# Patient Record
Sex: Female | Born: 1980 | Race: White | Hispanic: No | Marital: Married | Smoking: Never smoker
Health system: Southern US, Community
[De-identification: ages and names within clinical notes are randomized; demographics above are authoritative.]

## PROBLEM LIST (undated history)

## (undated) ENCOUNTER — Inpatient Hospital Stay (HOSPITAL_COMMUNITY): Payer: Self-pay

## (undated) DIAGNOSIS — D6859 Other primary thrombophilia: Secondary | ICD-10-CM

## (undated) HISTORY — PX: OTHER SURGICAL HISTORY: SHX169

---

## 2015-01-05 HISTORY — PX: CERVICAL CERCLAGE: SHX1329

## 2015-03-11 ENCOUNTER — Encounter (HOSPITAL_COMMUNITY): Payer: Self-pay | Admitting: *Deleted

## 2015-03-11 ENCOUNTER — Inpatient Hospital Stay (HOSPITAL_COMMUNITY)
Admission: AD | Admit: 2015-03-11 | Discharge: 2015-03-11 | Disposition: A | Discharge status: Home / Self Care | Payer: Self-pay | Source: Ambulatory Visit | Attending: Obstetrics & Gynecology | Admitting: Obstetrics & Gynecology

## 2015-03-11 DIAGNOSIS — O2622 Pregnancy care for patient with recurrent pregnancy loss, second trimester: Secondary | ICD-10-CM

## 2015-03-11 DIAGNOSIS — O09292 Supervision of pregnancy with other poor reproductive or obstetric history, second trimester: Secondary | ICD-10-CM | POA: Insufficient documentation

## 2015-03-11 DIAGNOSIS — Z3A19 19 weeks gestation of pregnancy: Secondary | ICD-10-CM | POA: Insufficient documentation

## 2015-03-11 DIAGNOSIS — O0990 Supervision of high risk pregnancy, unspecified, unspecified trimester: Secondary | ICD-10-CM

## 2015-03-11 MED ORDER — ENOXAPARIN SODIUM 40 MG/0.4ML ~~LOC~~ SOLN: 40.0000 mg | SUBCUTANEOUS | Status: DC

## 2015-03-11 NOTE — Discharge Instructions (Signed)
Recurrent Pregnancy Loss  Recurrent pregnancy loss is the loss of three or more pregnancies before 20 weeks of gestation. Losing three or more pregnancies in a row is rare.   CAUSES   The most common cause of recurrent pregnancy loss is an abnormal number of chromosomes in the developing baby (fetus). Chromosomes are the structures inside cells that hold all your genetic material. In most cases of recurrent pregnancy loss, a missing or extra chromosome keeps a baby from developing. It may not be possible to identify which chromosome is defective.  Chromosome abnormalities can be inherited, but most of the time they occur by chance. Other possible causes of recurrent pregnancy loss include:  · Being born with an abnormal womb structure (septate uterus).  · Having noncancerous growths in your uterus (fibroids or polyps).  · Having a disease that causes scarring in your uterus (Asherman syndrome).  · Having a disease that causes your blood to clot (antiphospholipid syndrome).  · Having a disease that increases bleeding (thrombophilia).  RISK FACTORS  The risk of recurrent pregnancy loss increases as your age increases. Other risk factors include:  · Diabetes.  · Thyroid disease.  · Obesity.  · Smoking.  · Excessive alcohol or caffeine.  · Recreational drug use.  SIGNS AND SYMPTOMS  · Vaginal bleeding.  · Passing clots vaginally.  · Abdominal pain or cramps.  · Low back pain.  DIAGNOSIS   Your health care provider can create an image of your womb using sound waves and a computer (ultrasound) to confirm your pregnancy by 5 or 6 weeks after you conceive. Ultrasound can also confirm a pregnancy loss. Your health care provider may do a complete physical exam, including your vagina and uterus (pelvic exam), to find possible causes of recurrent pregnancy loss.   Other tests may include:  · Ultrasound imaging to see if the structure of your uterus is normal or if you have polyps or fibroids.  · Blood tests to see if you have a  disease that causes recurrent pregnancy loss.  · Blood tests to see if your blood clots normally.  · Genetic testing of you and your partner.  TREATMENT   In many cases, there is no specific treatment. Depending on the cause, possible treatments include:  · Having your eggs fertilized outside your uterus (in vitro fertilization). By doing this, a health care provider may be able to select eggs without chromosome abnormalities.  · Taking a blood thinner to prevent clotting if you have antiphospholipid syndrome.  · Having corrective surgery if you have an abnormality in your uterus.  HOME CARE INSTRUCTIONS   Follow all your health care provider's home instructions carefully. These may include:  · Do not smoke or use recreational drugs.  · Do not drink alcohol if you are pregnant.  · Do not put anything in your vagina or have sex for 2 weeks after you have had a miscarriage.  · If you are Rh negative and have had a miscarriage, you may need to get a shot of Rho (D) immune globulin. Ask your doctor if you need this shot.  · Use birth control if you do not want to get pregnant. You can get pregnant [redacted] weeks after a miscarriage.  · Get support from friends and loved ones. Miscarriage can be a sad and stressful event.  SEEK MEDICAL CARE IF:   · You have light vaginal bleeding or spotting while pregnant.  · You have been trying to get pregnant   without success.  · You are struggling with sadness or depression after a miscarriage.  SEEK IMMEDIATE MEDICAL CARE IF:  · You have heavy vaginal bleeding and abdominal cramps.  · You have fever, chills, and severe abdominal pain.   Document Released: 01/25/2008 Document Revised: 08/13/2013 Document Reviewed: 05/31/2013  ExitCare® Patient Information ©2015 [employer]. This information is not intended to replace advice given to you by your health care provider. Make sure you discuss any questions you have with your health care provider.

## 2015-03-11 NOTE — MAU Note (Signed)
Pt reports she is here to get an emergency medicine that she has been prescribed in [place] to prevent preg loss.

## 2015-03-11 NOTE — MAU Provider Note (Signed)
  History     CSN: [id number]  Arrival date and time: 03/11/15 2035   First Provider Initiated Contact with Patient 03/11/15 2254      No chief complaint on file.  HPI   Elizabeth Sanford 34 y.o. [id number] @19weeks  per pt presents for refill of medication.  She is from [place] and moved here 2 weeks ago.  Living in hotel at present looking for housing.  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  Was put on enoxaparin daily to help retain pregnancy.  NO complaints today.  Denies LOF, VB, dysuria.   Uses enoxaparin daily injection.    OB History    Gravida Para Term Preterm AB TAB SAB Ectopic Multiple Living   12    10  10   1       History reviewed. No pertinent past medical history.  Past Surgical History  Procedure Laterality Date  . Cesarean section      History reviewed. No pertinent family history.  History  Substance Use Topics  . Smoking status: Never Smoker   . Smokeless tobacco: Never Used  . Alcohol Use: No    Allergies: No Known Allergies  Prescriptions prior to admission  Medication Sig Dispense Refill Last Dose  . aspirin 81 MG tablet Take 81 mg by mouth daily.   03/11/2015 at Unknown time  . enoxaparin (LOVENOX) 40 MG/0.4ML injection Inject 40 mg into the skin daily.   03/10/2015 at 2300  . omega-3 acid ethyl esters (LOVAZA) 1 G capsule Take 1 g by mouth daily.   03/10/2015 at Unknown time  . predniSONE (DELTASONE) 5 MG tablet Take 5 mg by mouth 2 (two) times daily with a meal.   03/11/2015 at Unknown time  . Prenatal Vit-Fe Fumarate-FA (PRENATAL MULTIVITAMIN) TABS tablet Take 1 tablet by mouth daily at 12 noon.   03/10/2015 at Unknown time    ROS Pertinent ROS in HPI.  All other systems are negative.   Physical Exam   Blood pressure 111/70, pulse 104, temperature 99.1 F (37.3 C), temperature source Oral, resp. rate 16, height 5\' 4"  (1.626 m), weight 170 lb (77.111 kg), SpO2 100 %.  Physical Exam  Constitutional: She is oriented to person,  place, and time. She appears well-developed and well-nourished. No distress.  HENT:  Head: Normocephalic and atraumatic.  Eyes: EOM are normal.  Neck: Normal range of motion.  Cardiovascular: Normal rate, regular rhythm and normal heart sounds.   Respiratory: Effort normal and breath sounds normal. No respiratory distress.  GI: Soft. Bowel sounds are normal. She exhibits no distension. There is no tenderness.  Musculoskeletal: Normal range of motion.  Neurological: She is alert and oriented to person, place, and time.  Skin: Skin is warm and dry.  Psychiatric: She has a normal mood and affect.    MAU Course  Procedures  MDM Discussed with Dr. Despina HiddenEure.  He is agreeable to provide rx for further enoxeparin and [facility]    Assessment and Plan  A:  1. 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  Email sent to admin staff Patient may return to MAU as needed or if her condition were to change or worsen  Interpreter used in room for all communication.  Bertram Denvereague Clark, Mykenzie Ebanks E 03/11/2015, 10:59 PM

## 2015-03-17 ENCOUNTER — Encounter: Payer: Self-pay | Admitting: Obstetrics and Gynecology

## 2015-03-28 ENCOUNTER — Telehealth: Payer: Self-pay | Admitting: Obstetrics and Gynecology

## 2015-03-28 MED ORDER — ENOXAPARIN SODIUM 40 MG/0.4ML ~~LOC~~ SOLN: 40.0000 mg | SUBCUTANEOUS | Status: DC

## 2015-03-28 NOTE — Telephone Encounter (Signed)
Patient here for medication refill. The patient has an appointment on Aug 11 in the high risk clinic. She was prescribed Lovenox 40 sub Q on June 11th here in MAU, and has run out of the medication. She needs enough to get her until her appointment on August 11th. The patient was prescribed this medication in [place] due to multiple SAB's. I discussed this plan with Dr. Debroah Loop who agrees that we will give the patient 6 more refills to get her until her appointment in the [facility].

## 2015-04-02 ENCOUNTER — Ambulatory Visit (INDEPENDENT_AMBULATORY_CARE_PROVIDER_SITE_OTHER): Payer: Self-pay | Admitting: Family Medicine

## 2015-04-02 ENCOUNTER — Encounter: Payer: Self-pay | Admitting: Family Medicine

## 2015-04-02 VITALS — BP 117/71 | HR 100 | Temp 98.9°F | Wt 176.1 lb

## 2015-04-02 DIAGNOSIS — Z118 Encounter for screening for other infectious and parasitic diseases: Secondary | ICD-10-CM

## 2015-04-02 DIAGNOSIS — O99119 Other diseases of the blood and blood-forming organs and certain disorders involving the immune mechanism complicating pregnancy, unspecified trimester: Secondary | ICD-10-CM

## 2015-04-02 DIAGNOSIS — D688 Other specified coagulation defects: Secondary | ICD-10-CM

## 2015-04-02 DIAGNOSIS — Z9889 Other specified postprocedural states: Secondary | ICD-10-CM

## 2015-04-02 DIAGNOSIS — O2622 Pregnancy care for patient with recurrent pregnancy loss, second trimester: Secondary | ICD-10-CM

## 2015-04-02 DIAGNOSIS — Z98891 History of uterine scar from previous surgery: Secondary | ICD-10-CM | POA: Insufficient documentation

## 2015-04-02 DIAGNOSIS — Z113 Encounter for screening for infections with a predominantly sexual mode of transmission: Secondary | ICD-10-CM

## 2015-04-02 DIAGNOSIS — Z124 Encounter for screening for malignant neoplasm of cervix: Secondary | ICD-10-CM

## 2015-04-02 DIAGNOSIS — Z1151 Encounter for screening for human papillomavirus (HPV): Secondary | ICD-10-CM

## 2015-04-02 DIAGNOSIS — O3432 Maternal care for cervical incompetence, second trimester: Secondary | ICD-10-CM

## 2015-04-02 DIAGNOSIS — O0992 Supervision of high risk pregnancy, unspecified, second trimester: Secondary | ICD-10-CM

## 2015-04-02 DIAGNOSIS — D6859 Other primary thrombophilia: Secondary | ICD-10-CM

## 2015-04-02 DIAGNOSIS — O3430 Maternal care for cervical incompetence, unspecified trimester: Secondary | ICD-10-CM | POA: Insufficient documentation

## 2015-04-02 DIAGNOSIS — O2620 Pregnancy care for patient with recurrent pregnancy loss, unspecified trimester: Secondary | ICD-10-CM | POA: Insufficient documentation

## 2015-04-02 LAB — POCT URINALYSIS DIP (DEVICE)
Bilirubin Urine: NEGATIVE
Glucose, UA: NEGATIVE mg/dL
KETONES UR: 15 mg/dL — AB
Leukocytes, UA: NEGATIVE
Nitrite: NEGATIVE
PH: 5.5 (ref 5.0–8.0)
Protein, ur: NEGATIVE mg/dL
Urobilinogen, UA: 0.2 mg/dL (ref 0.0–1.0)

## 2015-04-02 MED ORDER — ENOXAPARIN SODIUM 40 MG/0.4ML ~~LOC~~ SOLN: 40.0000 mg | SUBCUTANEOUS | Status: DC

## 2015-04-02 NOTE — Progress Notes (Signed)
Subjective:    Elizabeth Sanford is a [id number] [redacted]w[redacted]d by LMP being seen today for her first obstetrical visit.  Her obstetrical history is significant for Recurrent pregnancy loss and previous cesarean section for breech. The patient has had 10 first trimester losses since her first delivery and now. She had testing in [place] which revealed protein C and protein S deficiencies. At the onset of her pregnancy, the patient was started on Lovenox, aspirin, and prednisone (5 mg twice a day). Additionally, around 14 weeks the patient had a cerclage placed ([employer] stitch) and she was started on oral progestin. At first the patient was unsure of the procedure that was done. However, in my presence the patient called her physician in [place], who related that she had a [employer] stitch.  Patient does intend to breast feed. Pregnancy history fully reviewed.  Patient reports no complaints.  Filed Vitals:   04/02/15 0843  BP: 117/71  Pulse: 100  Temp: 98.9 F (37.2 C)  Weight: 176 lb 1.6 oz (79.878 kg)    HISTORY: OB History  Gravida Para Term Preterm AB SAB TAB Ectopic Multiple Living  12 1 1  10 10    1     # Outcome Date GA Lbr Len/2nd Weight Sex Delivery Anes PTL Lv  12 Current           11 SAB 2015          10 SAB 2014          9 SAB 2013          8 SAB 2012          7 SAB 2011          6 SAB 2010          5 SAB 2010          4 SAB 2009          3 SAB 2008          2 SAB 2007          1 Term 02/20/05 [redacted]w[redacted]d    CS-Unspec   Y     Comments: c/s breech, born in [place]. No other complications     History reviewed. No pertinent past medical history. Past Surgical History  Procedure Laterality Date  . Cesarean section    . Laparascopy    . Cervical cerclage  01/05/15   Family History  Problem Relation Age of Onset  . Hypertension Mother   . Diabetes Mother   . Hypertension Father      Exam    Uterus:     Pelvic Exam:    Perineum: No Hemorrhoids, Normal Perineum    Vulva: normal   Vagina:  normal mucosa, normal discharge       Cervix: no cervical motion tenderness and nulliparous appearance the cerclage was not visualized. It was palpated with the knot in the 6 o'clock position    Adnexa: not evaluated   Bony Pelvis: gynecoid  System:     Skin: normal coloration and turgor, no rashes    Neurologic: oriented, normal mood, gait normal; reflexes normal and symmetric   Extremities: normal strength, tone, and muscle mass   HEENT PERRLA, extra ocular movement intact and sclera clear, anicteric   Mouth/Teeth mucous membranes moist, pharynx normal without lesions   Neck supple and no masses   Cardiovascular: regular rate and rhythm, no murmurs or gallops   Respiratory:  appears well, vitals normal, no  respiratory distress, acyanotic, normal RR, ear and throat exam is normal, neck free of mass or lymphadenopathy, chest clear, no wheezing, crepitations, rhonchi, normal symmetric air entry   Abdomen: soft, non-tender; bowel sounds normal; no masses,  no organomegaly   Urinary: urethral meatus normal      Assessment:    Pregnancy: [id number] Patient Active Problem List   Diagnosis Date Noted  . Protein C deficiency affecting pregnancy 04/02/2015  . Protein S deficiency 04/02/2015  . Recurrent pregnancy loss with current pregnancy 04/02/2015  . Previous cesarean section 04/02/2015  . Supervision of high-risk pregnancy 03/11/2015        Plan:     Initial labs drawn. Prenatal vitamins. Problem list reviewed and updated. Genetic Screening discussed: too late to care.Marland Kitchen  Ultrasound discussed; fetal survey: requested and ordered  Follow up in 4 weeks. 75% % of 45 min visit spent on counseling and coordination of care.  Continue Lovenox, aspirin, progesterone. Will evaluate cervical length on ultrasound. If shortened, will continue with progesterone. If it appears that the cervix is normal length, can stop the progesterone. Also discussed that the  prednisone is not a typical treatment for recurrent pregnancy loss. I discussed with her regarding weaning the prednisone, which she had started at the beginning of her pregnancy. We'll wean to prednisone 5 mg daily for the next 2 weeks, then 2.5 mg until her next appointment. I discussed with her that if she has any problems or side effects from the withdrawal, she should call our office to notify us.   Candelaria Celeste JEHIEL 04/02/2015

## 2015-04-02 NOTE — Progress Notes (Signed)
Here for first visit, from [place]. Moderate leukocytes and hemoglobin noted on urinalysis. Given prenatal education booklets. Used Interpreter [employer].

## 2015-04-02 NOTE — Progress Notes (Signed)
Nutrition note: 1st visit consult Pt has gained 6.1# @ [redacted]w[redacted]d, which is wnl. Pt reports eating 3 meals & 2 snacks/d. Pt is taking a PNV. Pt reports no N&V but has some heartburn. NKFA. Pt received verbal & written education via an interpreter on general nutrition during pregnancy. Discussed tips to decrease heartburn. Discussed BF tip of the week & benefits of rooming-in. Discussed wt gain goals of 15-25# or 0.6#/wk. Pt agrees to continue taking a PNV. Pt has WIC & plans to BF. F/u as needed Blondell Reveal, MS, RD, LDN, [facility]

## 2015-04-02 NOTE — Progress Notes (Signed)
Ultrasound scheduled for 04/08/2015 @ 1:00PM

## 2015-04-03 ENCOUNTER — Other Ambulatory Visit: Payer: Self-pay | Admitting: Obstetrics & Gynecology

## 2015-04-03 LAB — CULTURE, OB URINE: Colony Count: 30000

## 2015-04-03 LAB — GLUCOSE TOLERANCE, 1 HOUR (50G) W/O FASTING: Glucose, 1 Hour GTT: 116 mg/dL (ref 70–140)

## 2015-04-03 LAB — CYTOLOGY - PAP

## 2015-04-03 MED ORDER — ENOXAPARIN SODIUM 40 MG/0.4ML ~~LOC~~ SOLN: 40.0000 mg | SUBCUTANEOUS | Status: DC

## 2015-04-03 NOTE — Telephone Encounter (Signed)
Pt had an issue with the prescription being received by Nicolette Bang so I re prescribed it via EPIC upon request

## 2015-04-06 ENCOUNTER — Other Ambulatory Visit: Payer: Self-pay | Admitting: Family Medicine

## 2015-04-06 LAB — HEMOGLOBINOPATHY EVALUATION
HGB F QUANT: 0 % (ref 0.0–2.0)
HGB S QUANTITAION: 0 %
Hemoglobin Other: 0 %
Hgb A2 Quant: 2.7 % (ref 2.2–3.2)
Hgb A: 97.3 % (ref 96.8–97.8)

## 2015-04-06 LAB — PRENATAL PROFILE (SOLSTAS)
ANTIBODY SCREEN: NEGATIVE
Basophils Absolute: 0 10*3/uL (ref 0.0–0.1)
Basophils Relative: 0 % (ref 0–1)
EOS ABS: 0 10*3/uL (ref 0.0–0.7)
EOS PCT: 0 % (ref 0–5)
HCT: 32.8 % — ABNORMAL LOW (ref 36.0–46.0)
HIV 1&2 Ab, 4th Generation: NONREACTIVE
Hemoglobin: 11.1 g/dL — ABNORMAL LOW (ref 12.0–15.0)
Hepatitis B Surface Ag: NEGATIVE
Lymphocytes Relative: 21 % (ref 12–46)
Lymphs Abs: 3.5 10*3/uL (ref 0.7–4.0)
MCH: 30 pg (ref 26.0–34.0)
MCHC: 33.8 g/dL (ref 30.0–36.0)
MCV: 88.6 fL (ref 78.0–100.0)
MONO ABS: 1 10*3/uL (ref 0.1–1.0)
MPV: 10.2 fL (ref 8.6–12.4)
Monocytes Relative: 6 % (ref 3–12)
Neutro Abs: 12.3 10*3/uL — ABNORMAL HIGH (ref 1.7–7.7)
Neutrophils Relative %: 73 % (ref 43–77)
PLATELETS: 225 10*3/uL (ref 150–400)
RBC: 3.7 MIL/uL — AB (ref 3.87–5.11)
RDW: 14 % (ref 11.5–15.5)
RH TYPE: POSITIVE
RUBELLA: 16.4 {index} — AB (ref <0.90)
WBC: 16.8 10*3/uL — ABNORMAL HIGH (ref 4.0–10.5)

## 2015-04-06 MED ORDER — ENOXAPARIN SODIUM 40 MG/0.4ML ~~LOC~~ SOLN: 40.0000 mg | SUBCUTANEOUS | Status: AC

## 2015-04-07 ENCOUNTER — Other Ambulatory Visit: Payer: Self-pay | Admitting: Family Medicine

## 2015-04-07 DIAGNOSIS — O09522 Supervision of elderly multigravida, second trimester: Secondary | ICD-10-CM

## 2015-04-07 DIAGNOSIS — Z1389 Encounter for screening for other disorder: Secondary | ICD-10-CM

## 2015-04-07 DIAGNOSIS — Z363 Encounter for antenatal screening for malformations: Secondary | ICD-10-CM

## 2015-04-07 DIAGNOSIS — Z3A23 23 weeks gestation of pregnancy: Secondary | ICD-10-CM

## 2015-04-07 DIAGNOSIS — O0992 Supervision of high risk pregnancy, unspecified, second trimester: Secondary | ICD-10-CM

## 2015-04-07 LAB — PRESCRIPTION MONITORING PROFILE (19 PANEL)
AMPHETAMINE/METH: NEGATIVE ng/mL
BUPRENORPHINE, URINE: NEGATIVE ng/mL
Barbiturate Screen, Urine: NEGATIVE ng/mL
Benzodiazepine Screen, Urine: NEGATIVE ng/mL
Cannabinoid Scrn, Ur: NEGATIVE ng/mL
Carisoprodol, Urine: NEGATIVE ng/mL
Cocaine Metabolites: NEGATIVE ng/mL
Creatinine, Urine: 155.75 mg/dL (ref >20.0)
ECSTASY: NEGATIVE ng/mL
Fentanyl, Ur: NEGATIVE ng/mL
METHAQUALONE SCREEN (URINE): NEGATIVE ng/mL
Meperidine, Ur: NEGATIVE ng/mL
Methadone Screen, Urine: NEGATIVE ng/mL
NITRITES URINE, INITIAL: NEGATIVE ug/mL
Opiate Screen, Urine: NEGATIVE ng/mL
Oxycodone Screen, Ur: NEGATIVE ng/mL
PHENCYCLIDINE, UR: NEGATIVE ng/mL
PROPOXYPHENE: NEGATIVE ng/mL
Tapentadol, urine: NEGATIVE ng/mL
Tramadol Scrn, Ur: NEGATIVE ng/mL
Zolpidem, Urine: NEGATIVE ng/mL
pH, Initial: 5.8 pH (ref 4.5–8.9)

## 2015-04-08 ENCOUNTER — Encounter (HOSPITAL_COMMUNITY): Payer: Self-pay

## 2015-04-08 ENCOUNTER — Ambulatory Visit (HOSPITAL_COMMUNITY)
Admission: RE | Admit: 2015-04-08 | Discharge: 2015-04-08 | Disposition: A | Discharge status: Home / Self Care | Payer: Self-pay | Source: Ambulatory Visit | Attending: Family Medicine | Admitting: Family Medicine

## 2015-04-08 DIAGNOSIS — Z36 Encounter for antenatal screening of mother: Secondary | ICD-10-CM | POA: Insufficient documentation

## 2015-04-08 DIAGNOSIS — O0992 Supervision of high risk pregnancy, unspecified, second trimester: Secondary | ICD-10-CM

## 2015-04-08 DIAGNOSIS — Z1389 Encounter for screening for other disorder: Secondary | ICD-10-CM

## 2015-04-08 DIAGNOSIS — Z363 Encounter for antenatal screening for malformations: Secondary | ICD-10-CM

## 2015-04-08 DIAGNOSIS — Z3A23 23 weeks gestation of pregnancy: Secondary | ICD-10-CM

## 2015-04-08 DIAGNOSIS — O09522 Supervision of elderly multigravida, second trimester: Secondary | ICD-10-CM

## 2015-04-10 ENCOUNTER — Encounter: Payer: Self-pay | Admitting: Family Medicine

## 2015-04-10 DIAGNOSIS — O09529 Supervision of elderly multigravida, unspecified trimester: Secondary | ICD-10-CM | POA: Insufficient documentation

## 2015-04-14 ENCOUNTER — Telehealth: Payer: Self-pay | Admitting: General Practice

## 2015-04-14 DIAGNOSIS — N96 Recurrent pregnancy loss: Secondary | ICD-10-CM

## 2015-04-14 MED ORDER — PROGESTERONE MICRONIZED 200 MG PO CAPS: 200.0000 mg | ORAL_CAPSULE | Freq: Every day | ORAL | Status: DC

## 2015-04-14 NOTE — Telephone Encounter (Addendum)
Per Dr Adrian Blackwater, patient's ultrasound and cervical length were normal. Patient should continue progesterone & authorized Rx for prometrium  vaginally at night. Called patient with pacific interpreter [phone] and informed her of ultrasound results and medication recommendations. Patient verbalized understanding and asked if she could continue the current progesterone she is on until it runs out then start the new one. Told patient that she certainly could but the recommendation was for her to switch to ours and discontinue her current one. Patient verbalized understanding and asked about her blood work. Told patient everything came back fine on her blood work as well. Patient asked about WBC count because in [place] it was very high and they had her on antibiotics already. Told patient that her white count was elevated but the doctor didn't recommend anything for that as they are numerous reasons her white count could be elevated. Patient verbalized understanding and had no other questions at this time.

## 2015-04-25 ENCOUNTER — Other Ambulatory Visit: Payer: Self-pay | Admitting: Advanced Practice Midwife

## 2015-04-25 DIAGNOSIS — O2622 Pregnancy care for patient with recurrent pregnancy loss, second trimester: Secondary | ICD-10-CM

## 2015-04-25 MED ORDER — PREDNISONE 2.5 MG PO TABS: 2.5000 mg | ORAL_TABLET | Freq: Every day | ORAL | Status: DC

## 2015-04-25 NOTE — Progress Notes (Signed)
Came to MAU desk needing prednisone refill. Is [facility] patient. Ran out and clinic is closed today. Was Rx'd prednisone in [place] for recurrent first trimester SABs. Per NOB note 04/02/15 w/ Dr. Adrian Blackwater plan is to wean pt off. Instructed to take 5 mg QD x 2 weeks (8/11-8/25), then decrease to 2.5 mg per day until next appt. (scheduked 9/8). Will Rx 1 week supply of prednisone 2.5 mg PO QD.

## 2015-04-25 NOTE — Addendum Note (Signed)
Addended by: Dorathy Kinsman on: 04/25/2015 03:16 PM   Modules accepted: Orders

## 2015-04-30 ENCOUNTER — Ambulatory Visit (INDEPENDENT_AMBULATORY_CARE_PROVIDER_SITE_OTHER): Payer: Self-pay | Admitting: Obstetrics & Gynecology

## 2015-04-30 VITALS — BP 129/68 | HR 88 | Temp 98.2°F | Wt 186.3 lb

## 2015-04-30 DIAGNOSIS — O0992 Supervision of high risk pregnancy, unspecified, second trimester: Secondary | ICD-10-CM

## 2015-04-30 DIAGNOSIS — O2622 Pregnancy care for patient with recurrent pregnancy loss, second trimester: Secondary | ICD-10-CM

## 2015-04-30 DIAGNOSIS — D6859 Other primary thrombophilia: Secondary | ICD-10-CM

## 2015-04-30 DIAGNOSIS — D688 Other specified coagulation defects: Secondary | ICD-10-CM

## 2015-04-30 DIAGNOSIS — O99119 Other diseases of the blood and blood-forming organs and certain disorders involving the immune mechanism complicating pregnancy, unspecified trimester: Principal | ICD-10-CM

## 2015-04-30 DIAGNOSIS — O09522 Supervision of elderly multigravida, second trimester: Secondary | ICD-10-CM

## 2015-04-30 LAB — POCT URINALYSIS DIP (DEVICE)
Bilirubin Urine: NEGATIVE
GLUCOSE, UA: NEGATIVE mg/dL
Ketones, ur: NEGATIVE mg/dL
Leukocytes, UA: NEGATIVE
Nitrite: NEGATIVE
PROTEIN: NEGATIVE mg/dL
UROBILINOGEN UA: 0.2 mg/dL (ref 0.0–1.0)
pH: 6 (ref 5.0–8.0)

## 2015-04-30 NOTE — Progress Notes (Signed)
Elizabeth Sanford used for interpreter Patient still taking progesterone prescribed from Dr in [place], has not started our prescription yet  Discussed tdap/flu with patient, patient prefers to receive at [facility] due to cost Reviewed tip of week with patient

## 2015-04-30 NOTE — Patient Instructions (Signed)
Return to clinic for any obstetric concerns or go to MAU for evaluation  

## 2015-04-30 NOTE — Progress Notes (Signed)
Subjective:  Elizabeth Sanford is a 34 y.o. [id number] at [redacted]w[redacted]d being seen today for ongoing prenatal care. Arabic interpreter present. Patient reports no complaints.  Contractions: Not present.  Vag. Bleeding: None. Movement: Present. Denies leaking of fluid.   The following portions of the patient's history were reviewed and updated as appropriate: allergies, current medications, past family history, past medical history, past social history, past surgical history and problem list.   Objective:   Filed Vitals:   04/30/15 0934  BP: 129/68  Pulse: 88  Temp: 98.2 F (36.8 C)  Weight: 186 lb 4.8 oz (84.505 kg)    Fetal Status: Fetal Heart Rate (bpm): 164 Fundal Height: 29 cm Movement: Present     General:  Alert, oriented and cooperative. Patient is in no acute distress.  Skin: Skin is warm and dry. No rash noted.   Cardiovascular: Normal heart rate noted  Respiratory: Normal respiratory effort, no problems with respiration noted  Abdomen: Soft, gravid, appropriate for gestational age. Pain/Pressure: Present     Pelvic: Vag. Bleeding: None     Cervical exam deferred        Extremities: Normal range of motion.  Edema: None  Mental Status: Normal mood and affect. Normal behavior. Normal judgment and thought content.   Urinalysis: Urine Protein: Negative Urine Glucose: Negative  Assessment and Plan:  Pregnancy: [id number] at [redacted]w[redacted]d  1. Protein C  and S deficiency affecting pregnancy 2. Recurrent pregnancy loss with current pregnancy, second trimester Refused to stop taking Prednisone; MD in [place] told her to take until 9 months Warned about side effects of prolonged steroid exposure, advised to take lowest dose possible  3. AMA (advanced maternal age) multigravida 35+, second trimester  Preterm labor symptoms and general obstetric precautions including but not limited to vaginal bleeding, contractions, leaking of fluid and fetal movement were reviewed in detail with the patient. Please  refer to After Visit Summary for other counseling recommendations.  Return in about 3 weeks (around 05/21/2015) for 1 hr GTT, 3rd trimester labs.   Tereso Newcomer, MD

## 2015-05-05 ENCOUNTER — Telehealth: Payer: Self-pay | Admitting: General Practice

## 2015-05-05 DIAGNOSIS — O2622 Pregnancy care for patient with recurrent pregnancy loss, second trimester: Secondary | ICD-10-CM

## 2015-05-05 DIAGNOSIS — N96 Recurrent pregnancy loss: Secondary | ICD-10-CM

## 2015-05-05 MED ORDER — PREDNISONE 2.5 MG PO TABS: 2.5000 mg | ORAL_TABLET | Freq: Every day | ORAL | Status: DC

## 2015-05-05 MED ORDER — PROGESTERONE MICRONIZED 200 MG PO CAPS: 200.0000 mg | ORAL_CAPSULE | Freq: Every day | ORAL | Status: DC

## 2015-05-05 NOTE — Telephone Encounter (Signed)
Patient called and left message stating she saw Dr Macon Large on 9/8. Patient's message states something about a prescription sent to her pharmacy but the message was unclear. Called patient back and she states the prednisone and progesterone prescription are not at her [facility] pharmacy. Patient states she went there Saturday and was told they had nothing for her. Told patient we will resubmit those to her pharmacy. Patient verbalized understanding and had no questions

## 2015-05-13 ENCOUNTER — Telehealth: Payer: Self-pay | Admitting: *Deleted

## 2015-05-13 MED ORDER — PROMETHAZINE HCL 25 MG PO TABS
25.0000 mg | ORAL_TABLET | Freq: Four times a day (QID) | ORAL | Status: DC | PRN
Start: 2015-05-13 — End: 2015-06-08

## 2015-05-13 NOTE — Telephone Encounter (Signed)
Elizabeth Sanford called again today at 11:32 and left another voice message. Hard to understand due to thick accent but she states she is calling about prednisone. States was prescribed at last visit and she asked for a month's worse but she only got 7 days worth. States not sure if doctor didn't understand her. States needs 14 more. Request a call.

## 2015-05-13 NOTE — Telephone Encounter (Addendum)
Called pt and informed her that it appears from Dr. Mont Dutton note on 9/8 that she understood what pt was asking for in regards to prednisone. Her note also stated that she had concerns for side effects and had discussed this with the pt. I will send a message to Dr. Macon Large with her request for additional prednisone and will call her back once I have received a response. I have sent a prescription for phenergan to her pharmacy which was approved by another physician. Pt voiced understanding of all information and instructions given.   9/22  1525  See note @ 1522 today.  Khylan Sawyer RNC

## 2015-05-13 NOTE — Telephone Encounter (Signed)
Pt left message stating that she was at the clinic on 9/8 and saw Dr. Macon Large. She further stated that she asked the doctor for a 30 day prescription of prednisone 2.5 mg but was only given Rx for 7 days (7 tablets total).  She is requesting additional Rx for prednisone. She also requests anti-emetic Rx as she has been nauseous.

## 2015-05-14 ENCOUNTER — Other Ambulatory Visit: Payer: Self-pay | Admitting: Obstetrics & Gynecology

## 2015-05-14 DIAGNOSIS — O2622 Pregnancy care for patient with recurrent pregnancy loss, second trimester: Secondary | ICD-10-CM

## 2015-05-14 MED ORDER — PREDNISONE 2.5 MG PO TABS: 2.5000 mg | ORAL_TABLET | Freq: Every day | ORAL | Status: DC

## 2015-05-14 NOTE — Progress Notes (Signed)
Called pt and informed her that the refill request for Prednisone 2.5 mg has been approved and sent to her pharmacy by Dr. Macon Large. She has been given #30 tablets. Once this prescription is completed, Dr. Macon Large recommends to reduce the strength to  tablets.  Pt voiced understanding of all information and agrees to reduction in strength of prednisone to  tablets after 30 days.

## 2015-05-20 ENCOUNTER — Ambulatory Visit (HOSPITAL_COMMUNITY): Admission: RE | Admit: 2015-05-20 | Payer: Self-pay | Source: Ambulatory Visit

## 2015-05-25 ENCOUNTER — Ambulatory Visit (INDEPENDENT_AMBULATORY_CARE_PROVIDER_SITE_OTHER): Payer: Self-pay | Admitting: Obstetrics and Gynecology

## 2015-05-25 ENCOUNTER — Encounter: Payer: Self-pay | Admitting: Obstetrics and Gynecology

## 2015-05-25 VITALS — BP 116/75 | HR 98 | Wt 188.5 lb

## 2015-05-25 DIAGNOSIS — O0993 Supervision of high risk pregnancy, unspecified, third trimester: Secondary | ICD-10-CM

## 2015-05-25 DIAGNOSIS — O3433 Maternal care for cervical incompetence, third trimester: Secondary | ICD-10-CM

## 2015-05-25 DIAGNOSIS — Z98891 History of uterine scar from previous surgery: Secondary | ICD-10-CM

## 2015-05-25 DIAGNOSIS — O09523 Supervision of elderly multigravida, third trimester: Secondary | ICD-10-CM

## 2015-05-25 DIAGNOSIS — D6859 Other primary thrombophilia: Secondary | ICD-10-CM

## 2015-05-25 DIAGNOSIS — O99119 Other diseases of the blood and blood-forming organs and certain disorders involving the immune mechanism complicating pregnancy, unspecified trimester: Secondary | ICD-10-CM

## 2015-05-25 LAB — POCT URINALYSIS DIP (DEVICE)
Bilirubin Urine: NEGATIVE
GLUCOSE, UA: NEGATIVE mg/dL
KETONES UR: 15 mg/dL — AB
Nitrite: NEGATIVE
Protein, ur: NEGATIVE mg/dL
SPECIFIC GRAVITY, URINE: 1.02 (ref 1.005–1.030)
Urobilinogen, UA: 0.2 mg/dL (ref 0.0–1.0)
pH: 6 (ref 5.0–8.0)

## 2015-05-25 LAB — GLUCOSE, CAPILLARY: Glucose-Capillary: 100 mg/dL — ABNORMAL HIGH (ref 65–99)

## 2015-05-25 NOTE — Progress Notes (Signed)
Subjective:  Elizabeth Sanford is a 34 y.o. [id number] at [redacted]w[redacted]d being seen today for ongoing prenatal care.  Patient reports backache.  Contractions: Not present.  Vag. Bleeding: None. Movement: Present. Denies leaking of fluid.   The following portions of the patient's history were reviewed and updated as appropriate: allergies, current medications, past family history, past medical history, past social history, past surgical history and problem list.   Objective:   Filed Vitals:   05/25/15 0820  BP: 116/75  Pulse: 98  Weight: 188 lb 8 oz (85.503 kg)    Fetal Status: Fetal Heart Rate (bpm): 160   Movement: Present     General:  Alert, oriented and cooperative. Patient is in no acute distress.  Skin: Skin is warm and dry. No rash noted.   Cardiovascular: Normal heart rate noted  Respiratory: Normal respiratory effort, no problems with respiration noted  Abdomen: Soft, gravid, appropriate for gestational age. Pain/Pressure: Present     Pelvic: Vag. Bleeding: None     Cervical exam deferred        Extremities: Normal range of motion.  Edema: None  Mental Status: Normal mood and affect. Normal behavior. Normal judgment and thought content.   Urinalysis:      Assessment and Plan:  Pregnancy: [id number] at [redacted]w[redacted]d  1. Supervision of high-risk pregnancy, third trimester Patient declined 1 hr GCT due to cost ([facility] [facility] mom program). 2hr p breakfast CBG 100. Patient may reconsider glucola test at next visit - CBC - RPR - HIV antibody (with reflex) - Patient will take 1/2 tablet of prednisone daily from now on  2. Protein S deficiency (HCC) 3. Protein C deficiency affecting pregnancy (HCC) Continue Lovenox Continue ASA  4. Previous cesarean section Discussed TOLAC vs RCS. Patient undecided. Will give an answer at next visit  5. Cervical cerclage suture present, third trimester Will remove at 36-37 weeks  6. AMA (advanced maternal age) multigravida 35+, third trimester   Preterm  labor symptoms and general obstetric precautions including but not limited to vaginal bleeding, contractions, leaking of fluid and fetal movement were reviewed in detail with the patient. Please refer to After Visit Summary for other counseling recommendations.  Return in about 2 weeks (around 06/08/2015).   Catalina Antigua, MD

## 2015-05-25 NOTE — Progress Notes (Signed)
Pt refuses to do 1 hr gtt.  Pt refuses to take tdap and flu shot in our clinic, states that she will go to [facility].

## 2015-05-25 NOTE — Addendum Note (Signed)
Addended by: Sherre Lain A on: 05/25/2015 10:34 AM   Modules accepted: Orders

## 2015-06-08 ENCOUNTER — Ambulatory Visit (INDEPENDENT_AMBULATORY_CARE_PROVIDER_SITE_OTHER): Payer: Self-pay | Admitting: Obstetrics and Gynecology

## 2015-06-08 VITALS — BP 131/67 | HR 96 | Temp 98.8°F | Wt 191.1 lb

## 2015-06-08 DIAGNOSIS — O3433 Maternal care for cervical incompetence, third trimester: Secondary | ICD-10-CM

## 2015-06-08 DIAGNOSIS — O0993 Supervision of high risk pregnancy, unspecified, third trimester: Secondary | ICD-10-CM

## 2015-06-08 DIAGNOSIS — D6859 Other primary thrombophilia: Secondary | ICD-10-CM

## 2015-06-08 DIAGNOSIS — O368131 Decreased fetal movements, third trimester, fetus 1: Secondary | ICD-10-CM

## 2015-06-08 DIAGNOSIS — Z98891 History of uterine scar from previous surgery: Secondary | ICD-10-CM

## 2015-06-08 DIAGNOSIS — O0990 Supervision of high risk pregnancy, unspecified, unspecified trimester: Secondary | ICD-10-CM

## 2015-06-08 DIAGNOSIS — O99119 Other diseases of the blood and blood-forming organs and certain disorders involving the immune mechanism complicating pregnancy, unspecified trimester: Secondary | ICD-10-CM

## 2015-06-08 LAB — CBC
HEMATOCRIT: 31.9 % — AB (ref 36.0–46.0)
Hemoglobin: 10.6 g/dL — ABNORMAL LOW (ref 12.0–15.0)
MCH: 29.7 pg (ref 26.0–34.0)
MCHC: 33.2 g/dL (ref 30.0–36.0)
MCV: 89.4 fL (ref 78.0–100.0)
MPV: 10.9 fL (ref 8.6–12.4)
PLATELETS: 200 10*3/uL (ref 150–400)
RBC: 3.57 MIL/uL — ABNORMAL LOW (ref 3.87–5.11)
RDW: 14.1 % (ref 11.5–15.5)
WBC: 11.3 10*3/uL — AB (ref 4.0–10.5)

## 2015-06-08 LAB — POCT URINALYSIS DIP (DEVICE)
Bilirubin Urine: NEGATIVE
Glucose, UA: NEGATIVE mg/dL
KETONES UR: NEGATIVE mg/dL
Nitrite: NEGATIVE
PH: 6 (ref 5.0–8.0)
PROTEIN: NEGATIVE mg/dL
Specific Gravity, Urine: 1.01 (ref 1.005–1.030)
UROBILINOGEN UA: 0.2 mg/dL (ref 0.0–1.0)

## 2015-06-08 NOTE — Progress Notes (Signed)
Used interpreter Clemens CatholicSarra Gabralla.    C/o feels baby is moving less since about 10 days ago. States before felt baby move more often, states now moves less often. States unable to state how much less, but that it is a change. FHR with baseline 158 with acceleration to 170 noted. [facility]. Will bring letter showing when they are done.

## 2015-06-08 NOTE — Progress Notes (Signed)
Ultrasound scheduled 06/12/2015 @ 7:30AM

## 2015-06-08 NOTE — Progress Notes (Signed)
Subjective:  Elizabeth Sanford is a 34 y.o. [id number] at 3218w5d being seen today for ongoing prenatal care.  Patient reports decreased fetal movement last week or two, no leakage of fluid or bleeding. .Contractions: Not present.  Vag. Bleeding: None. Movement: (!) Decreased. Denies leaking of fluid.   The following portions of the patient's history were reviewed and updated as appropriate: allergies, current medications, past family history, past medical history, past social history, past surgical history and problem list. Problem list updated.  Objective:   Filed Vitals:   06/08/15 0853  BP: 131/67  Pulse: 96  Temp: 98.8 F (37.1 C)  Weight: 191 lb 1.6 oz (86.682 kg)    Fetal Status: Fetal Heart Rate (bpm): 158 Fundal Height: 34 cm Movement: (!) Decreased     General:  Alert, oriented and cooperative. Patient is in no acute distress.  Skin: Skin is warm and dry. No rash noted.   Cardiovascular: Normal heart rate noted  Respiratory: Normal respiratory effort, no problems with respiration noted  Abdomen: Soft, gravid, appropriate for gestational age. Pain/Pressure: Absent     Pelvic: Vag. Bleeding: None     Cervical exam deferred        Extremities: Normal range of motion.  Edema: Trace  Mental Status: Normal mood and affect. Normal behavior. Normal judgment and thought content.   Urinalysis: Urine Protein: Negative Urine Glucose: Negative  Assessment and Plan:  Pregnancy: [id number] at 1818w5d  # Hx recurrent miscarriage - continuing lovenox/aspirin - growth scan ordered per mfm  # Hx c/s for breech - risks and benefits discussed, tolac consent signed  # Pregnancy - declined today 2/2 cost, [facility] - CBC today with 1-hour gtt, patient declines hiv/rpr despite my strong recommendation  # Decreased fetal movement - fundal height appropriate - NST today reactive, fundal height appropriate - growth scan as above   Preterm labor symptoms and general obstetric  precautions including but not limited to vaginal bleeding, contractions, leaking of fluid and fetal movement were reviewed in detail with the patient. Please refer to After Visit Summary for other counseling recommendations.  Return in about 2 weeks (around 06/22/2015).   Kathrynn RunningNoah Bedford Armen Waring, MD

## 2015-06-09 LAB — GLUCOSE TOLERANCE, 1 HOUR (50G) W/O FASTING: Glucose, 1 Hour GTT: 115 mg/dL (ref 70–140)

## 2015-06-11 ENCOUNTER — Encounter: Payer: Self-pay | Admitting: *Deleted

## 2015-06-12 ENCOUNTER — Encounter (HOSPITAL_COMMUNITY): Payer: Self-pay

## 2015-06-12 ENCOUNTER — Ambulatory Visit (HOSPITAL_COMMUNITY)
Admission: RE | Admit: 2015-06-12 | Discharge: 2015-06-12 | Disposition: A | Discharge status: Home / Self Care | Payer: Self-pay | Source: Ambulatory Visit | Attending: Obstetrics and Gynecology | Admitting: Obstetrics and Gynecology

## 2015-06-12 DIAGNOSIS — O09522 Supervision of elderly multigravida, second trimester: Secondary | ICD-10-CM | POA: Insufficient documentation

## 2015-06-12 HISTORY — DX: Other primary thrombophilia: D68.59

## 2015-06-15 ENCOUNTER — Encounter: Payer: Self-pay | Admitting: *Deleted

## 2015-06-22 ENCOUNTER — Ambulatory Visit (INDEPENDENT_AMBULATORY_CARE_PROVIDER_SITE_OTHER): Payer: Self-pay | Admitting: Obstetrics & Gynecology

## 2015-06-22 VITALS — BP 109/62 | HR 94 | Temp 97.9°F | Wt 196.4 lb

## 2015-06-22 DIAGNOSIS — O2623 Pregnancy care for patient with recurrent pregnancy loss, third trimester: Secondary | ICD-10-CM

## 2015-06-22 DIAGNOSIS — O2622 Pregnancy care for patient with recurrent pregnancy loss, second trimester: Secondary | ICD-10-CM

## 2015-06-22 DIAGNOSIS — O0993 Supervision of high risk pregnancy, unspecified, third trimester: Secondary | ICD-10-CM

## 2015-06-22 DIAGNOSIS — O09523 Supervision of elderly multigravida, third trimester: Secondary | ICD-10-CM

## 2015-06-22 LAB — POCT URINALYSIS DIP (DEVICE)
Bilirubin Urine: NEGATIVE
Glucose, UA: NEGATIVE mg/dL
Ketones, ur: NEGATIVE mg/dL
LEUKOCYTES UA: NEGATIVE
Nitrite: NEGATIVE
PROTEIN: NEGATIVE mg/dL
Specific Gravity, Urine: 1.025 (ref 1.005–1.030)
UROBILINOGEN UA: 0.2 mg/dL (ref 0.0–1.0)
pH: 6 (ref 5.0–8.0)

## 2015-06-22 MED ORDER — PANTOPRAZOLE SODIUM 40 MG PO TBEC
40.0000 mg | DELAYED_RELEASE_TABLET | Freq: Every day | ORAL | Status: DC
Start: 2015-06-22 — End: 2015-07-18

## 2015-06-22 NOTE — Progress Notes (Signed)
<  90 %ile on US 10 days, has reflux sx Subjective:  Elizabeth Sanford is a 34 y.o. [id number] at 3154w5d being seen today for ongoing prenatal care.  Patient reports heartburn.  Contractions: Not present.  Vag. Bleeding: None. Movement: Present. Denies leaking of fluid.   The following portions of the patient's history were reviewed and updated as appropriate: allergies, current medications, past family history, past medical history, past social history, past surgical history and problem list. Problem list updated.  Objective:   Filed Vitals:   06/22/15 0931  BP: 109/62  Pulse: 94  Temp: 97.9 F (36.6 C)  Weight: 196 lb 6.4 oz (89.086 kg)    Fetal Status: Fetal Heart Rate (bpm): 161   Movement: Present     General:  Alert, oriented and cooperative. Patient is in no acute distress.  Skin: Skin is warm and dry. No rash noted.   Cardiovascular: Normal heart rate noted  Respiratory: Normal respiratory effort, no problems with respiration noted  Abdomen: Soft, gravid, appropriate for gestational age. Pain/Pressure: Present     Pelvic: Vag. Bleeding: None     Cervical exam deferred        Extremities: Normal range of motion.  Edema: None  Mental Status: Normal mood and affect. Normal behavior. Normal judgment and thought content.   Urinalysis: Urine Protein: Negative Urine Glucose: Negative  Assessment and Plan:  Pregnancy: [id number] at 3454w5d  1. AMA (advanced maternal age) multigravida 35+, third trimester Transfer from [place]  2. Supervision of high-risk pregnancy, third trimester Protein c and s deficiency - POCT urinalysis dip (device) - US OB Follow Up; Future  3. Recurrent pregnancy loss with current pregnancy, second trimester Cerclage in place  Preterm labor symptoms and general obstetric precautions including but not limited to vaginal bleeding, contractions, leaking of fluid and fetal movement were reviewed in detail with the patient. Please refer to After Visit Summary for  other counseling recommendations.  Return in about 1 week (around 06/29/2015). Protonix for HB  Adam PhenixJames G Arnold, MD

## 2015-06-22 NOTE — Progress Notes (Signed)
Interpreter [employer] Mohammad Educated pt on Importance of Good Latch

## 2015-06-22 NOTE — Progress Notes (Signed)
Growth U/S 07/13/15 @ 830a with [facility].  Arabic interpreter: Azucena CecilNuha Mohammed present.

## 2015-06-29 ENCOUNTER — Ambulatory Visit (INDEPENDENT_AMBULATORY_CARE_PROVIDER_SITE_OTHER): Payer: Self-pay | Admitting: Family Medicine

## 2015-06-29 VITALS — BP 119/77 | HR 94 | Temp 98.6°F | Wt 199.3 lb

## 2015-06-29 DIAGNOSIS — O0993 Supervision of high risk pregnancy, unspecified, third trimester: Secondary | ICD-10-CM

## 2015-06-29 DIAGNOSIS — Z98891 History of uterine scar from previous surgery: Secondary | ICD-10-CM

## 2015-06-29 LAB — POCT URINALYSIS DIP (DEVICE)
BILIRUBIN URINE: NEGATIVE
GLUCOSE, UA: NEGATIVE mg/dL
KETONES UR: NEGATIVE mg/dL
Nitrite: NEGATIVE
Protein, ur: NEGATIVE mg/dL
SPECIFIC GRAVITY, URINE: 1.025 (ref 1.005–1.030)
Urobilinogen, UA: 0.2 mg/dL (ref 0.0–1.0)
pH: 6 (ref 5.0–8.0)

## 2015-06-29 NOTE — Progress Notes (Signed)
Arabic interpreter: Suzi RootsFeryal used Subjective:  Royalty Oneta Racklbosraty is a [age] y.o. [id number] at 2969w5d being seen today for ongoing prenatal care.  Patient reports no complaints.  Contractions: Irritability.  Vag. Bleeding: None. Movement: Present. Denies leaking of fluid.   The following portions of the patient's history were reviewed and updated as appropriate: allergies, current medications, past family history, past medical history, past social history, past surgical history and problem list. Problem list updated.  Objective:   Filed Vitals:   06/29/15 1000  BP: 119/77  Pulse: 94  Temp: 98.6 F (37 C)  Weight: 199 lb 4.8 oz (90.402 kg)    Fetal Status: Fetal Heart Rate (bpm): 150 Fundal Height: 37 cm Movement: Present  Presentation: Vertex  General:  Alert, oriented and cooperative. Patient is in no acute distress.  Skin: Skin is warm and dry. No rash noted.   Cardiovascular: Normal heart rate noted  Respiratory: Normal respiratory effort, no problems with respiration noted  Abdomen: Soft, gravid, appropriate for gestational age. Pain/Pressure: Present     Pelvic: Vag. Bleeding: None     Cervical exam deferred        Extremities: Normal range of motion.  Edema: Mild pitting, slight indentation  Mental Status: Normal mood and affect. Normal behavior. Normal judgment and thought content.   Urinalysis: Urine Protein: Negative Urine Glucose: Negative  Assessment and Plan:  Pregnancy: [id number] at 5369w5d  1. Supervision of high-risk pregnancy, third trimester Continue routine prenatal care.  2. Previous cesarean section TOLAC for now  3. Incompetent Cervix For removal with cultures next visit  4. Prot C/S deficiency Continue lovenox, off prednisone at this time.  Preterm labor symptoms and general obstetric precautions including but not limited to vaginal bleeding, contractions, leaking of fluid and fetal movement were reviewed in detail with the patient. Please refer to After  Visit Summary for other counseling recommendations.  Return in 1 week (on 07/06/2015).   Reva Boresanya S Jovanka Westgate, MD

## 2015-06-29 NOTE — Patient Instructions (Addendum)
FALSE LABOR You may feel small, irregular contractions that eventually go away. These are called Braxton Hicks contractions, or false labor. Contractions may last for hours, days, or even weeks before true labor sets in. If contractions come at regular intervals, intensify, or become painful, it is best to be seen by your caregiver.  SIGNS OF LABOR   Menstrual-like cramps.  Contractions that are 5 minutes apart or less.  Contractions that start on the top of the uterus and spread down to the lower abdomen and back.  A sense of increased pelvic pressure or back pain.  A watery or bloody mucus discharge that comes from the vagina. If you have any of these signs before the 37th week of pregnancy, call your caregiver right away. You need to go to the hospital to get checked immediately.  Vaginal Birth After Cesarean Delivery Vaginal birth after cesarean delivery (VBAC) is giving birth vaginally after previously delivering a baby by a cesarean. In the past, if a woman had a cesarean delivery, all births afterward would be done by cesarean delivery. This is no longer true. It can be safe for the mother to try a vaginal delivery after having a cesarean delivery.  It is important to discuss VBAC with your health care provider early in the pregnancy so you can understand the risks, benefits, and options. It will give you time to decide what is best in your particular case. The final decision about whether to have a VBAC or repeat cesarean delivery should be between you and your health care provider. Any changes in your health or your baby's health during your pregnancy may make it necessary to change your initial decision about VBAC.  WOMEN WHO PLAN TO HAVE A VBAC SHOULD CHECK WITH THEIR HEALTH CARE PROVIDER TO BE SURE THAT: The previous cesarean delivery was done with a low transverse uterine cut (incision) (not a vertical classical incision).  The birth canal is big enough for the baby.  There were  no other operations on the uterus.  An electronic fetal monitor (EFM) will be on at all times during labor.  An operating room will be available and ready in case an emergency cesarean delivery is needed.  A health care provider and surgical nursing staff will be available at all times during labor to be ready to do an emergency delivery cesarean if necessary.  An anesthesiologist will be present in case an emergency cesarean delivery is needed.  The nursery is prepared and has adequate personnel and necessary equipment available to care for the baby in case of an emergency cesarean delivery. BENEFITS OF VBAC Shorter stay in the hospital.  Avoidance of risks associated with cesarean delivery, such as: Surgical complications, such as opening of the incision or hernia in the incision. Injury to other organs. Fever. This can occur if an infection develops after surgery. It can also occur as a reaction to the medicine given to make you numb during the surgery. Less blood loss and need for blood transfusions. Lower risk of blood clots and infection. Shorter recovery.  Decreased risk for having to remove the uterus (hysterectomy).  Decreased risk for the placenta to completely or partially cover the opening of the uterus (placenta previa) with a future pregnancy.  Decrease risk in future labor and delivery. RISKS OF A VBAC Tearing (rupture) of the uterus. This is occurs in less than 1% of VBACs. The risk of this happening is higher if: Steps are taken to begin the labor process (induce  labor) or stimulate or strengthen contractions (augment labor).  Medicine is used to soften (ripen) the cervix. Having to remove the uterus (hysterectomy) if it ruptures. VBAC SHOULD NOT BE DONE IF: The previous cesarean delivery was done with a vertical (classical) or T-shaped incision or you do not know what kind of incision was made.  You had a ruptured uterus.  You have had certain types of surgery  on your uterus, such as removal of uterine fibroids. Ask your health care provider about other types of surgeries that prevent you from having a VBAC. You have certain medical or childbirth (obstetrical) problems.  There are problems with the baby.  You have had two previous cesarean deliveries and no vaginal deliveries. OTHER FACTS TO KNOW ABOUT VBAC: It is safe to have an epidural anesthetic with VBAC.  It is safe to turn the baby from a breech position (attempt an external cephalic version).  It is safe to try a VBAC with twins.  VBAC may not be successful if your baby weights 8.8 lb (4 kg) or more. However, weight predictions are not always accurate and should not be used alone to decide if VBAC is right for you. There is an increased failure rate if the time between the cesarean delivery and VBAC is less than 19 months.  Your health care provider may advise against a VBAC if you have preeclampsia (high blood pressure, protein in the urine, and swelling of face and extremities).  VBAC is often successful if you previously gave birth vaginally.  VBAC is often successful when the labor starts spontaneously before the due date.  Delivering a baby through a VBAC is similar to having a normal spontaneous vaginal delivery.   This information is not intended to replace advice given to you by your health care provider. Make sure you discuss any questions you have with your health care provider.   Document Released: 01/29/2007 Document Revised: 08/29/2014 Document Reviewed: 03/07/2013 Elsevier Interactive Patient Education [employer]

## 2015-06-29 NOTE — Progress Notes (Signed)
Patient reports pelvic pressure/pain and lower back pain  Feryal used for interpreter Patient [facility] & flu shot  Reviewed tip of week with patient

## 2015-07-09 ENCOUNTER — Encounter: Payer: Self-pay | Admitting: Family

## 2015-07-09 ENCOUNTER — Ambulatory Visit (INDEPENDENT_AMBULATORY_CARE_PROVIDER_SITE_OTHER): Payer: Self-pay | Admitting: Family

## 2015-07-09 VITALS — BP 123/73 | HR 105 | Wt 202.7 lb

## 2015-07-09 DIAGNOSIS — O99113 Other diseases of the blood and blood-forming organs and certain disorders involving the immune mechanism complicating pregnancy, third trimester: Secondary | ICD-10-CM

## 2015-07-09 DIAGNOSIS — D6859 Other primary thrombophilia: Secondary | ICD-10-CM

## 2015-07-09 DIAGNOSIS — R8271 Bacteriuria: Secondary | ICD-10-CM

## 2015-07-09 DIAGNOSIS — O0993 Supervision of high risk pregnancy, unspecified, third trimester: Secondary | ICD-10-CM

## 2015-07-09 DIAGNOSIS — Z113 Encounter for screening for infections with a predominantly sexual mode of transmission: Secondary | ICD-10-CM

## 2015-07-09 LAB — OB RESULTS CONSOLE GC/CHLAMYDIA: Gonorrhea: NEGATIVE

## 2015-07-09 LAB — POCT URINALYSIS DIP (DEVICE)
GLUCOSE, UA: NEGATIVE mg/dL
Ketones, ur: 15 mg/dL — AB
Nitrite: NEGATIVE
PH: 5 (ref 5.0–8.0)
PROTEIN: NEGATIVE mg/dL
SPECIFIC GRAVITY, URINE: 1.025 (ref 1.005–1.030)
UROBILINOGEN UA: 0.2 mg/dL (ref 0.0–1.0)

## 2015-07-09 LAB — OB RESULTS CONSOLE GBS: GBS: NEGATIVE

## 2015-07-09 NOTE — Progress Notes (Signed)
Pt got tdap and flu at [facility].

## 2015-07-09 NOTE — Progress Notes (Signed)
Subjective:  Elizabeth Sanford is a 34 y.o. [id number] at 3475w1d being seen today for ongoing prenatal care.  Patient reports no complaints.  Contractions: Not present.  Vag. Bleeding: None. Movement: Present. Denies leaking of fluid.   The following portions of the patient's history were reviewed and updated as appropriate: allergies, current medications, past family history, past medical history, past social history, past surgical history and problem list. Problem list updated.  Objective:   Filed Vitals:   07/09/15 1009  BP: 123/73  Pulse: 105  Weight: 202 lb 11.2 oz (91.944 kg)    Fetal Status: Fetal Heart Rate (bpm): 142 Fundal Height: 38 cm Movement: Present  Presentation: Vertex  General:  Alert, oriented and cooperative. Patient is in no acute distress.  Skin: Skin is warm and dry. No rash noted.   Cardiovascular: Normal heart rate noted  Respiratory: Normal respiratory effort, no problems with respiration noted  Abdomen: Soft, gravid, appropriate for gestational age. Pain/Pressure: Absent     Pelvic: Vag. Bleeding: None     Cervical exam performed        Extremities: Normal range of motion.  Edema: None  Mental Status: Normal mood and affect. Normal behavior. Normal judgment and thought content.  Cerclage removed with assistance from Dr. Macon LargeAnyanwu (knot anterior to cervix); slight bleeding resolved with swab and silver nitrate  Urinalysis:    Protein negative Glucose neg  Assessment and Plan:  Pregnancy: [id number] at 6075w1d  1. Supervision of high-risk pregnancy, third trimester - GBS and GC/CT  - Culture, beta strep (group b only) - Reviewed signs of labor and to report to MAU   2. Protein S deficiency (HCC) - Culture, beta strep (group b only) - GC/Chlamydia probe amp ([place] - Cerclage removal today - Continue Lovenox   3. Bacteria in urine - Culture, OB Urine   Term labor symptoms and general obstetric precautions including but not limited to  vaginal bleeding, contractions, leaking of fluid and fetal movement were reviewed in detail with the patient. Please refer to After Visit Summary for other counseling recommendations.  Return in about 11 days (around 07/20/2015).   Eino FarberWalidah Kennith GainN Karim, CNM

## 2015-07-10 ENCOUNTER — Encounter: Payer: Self-pay | Admitting: *Deleted

## 2015-07-10 DIAGNOSIS — O09529 Supervision of elderly multigravida, unspecified trimester: Secondary | ICD-10-CM

## 2015-07-10 LAB — CULTURE, OB URINE: Colony Count: 30000

## 2015-07-10 LAB — GC/CHLAMYDIA PROBE AMP (~~LOC~~) NOT AT ARMC
CHLAMYDIA, DNA PROBE: NEGATIVE
Neisseria Gonorrhea: NEGATIVE

## 2015-07-11 LAB — CULTURE, BETA STREP (GROUP B ONLY)

## 2015-07-13 ENCOUNTER — Encounter (HOSPITAL_COMMUNITY): Payer: Self-pay

## 2015-07-13 ENCOUNTER — Ambulatory Visit (HOSPITAL_COMMUNITY)
Admission: RE | Admit: 2015-07-13 | Discharge: 2015-07-13 | Disposition: A | Discharge status: Home / Self Care | Payer: Medicaid Other | Source: Ambulatory Visit | Attending: Obstetrics & Gynecology | Admitting: Obstetrics & Gynecology

## 2015-07-13 ENCOUNTER — Other Ambulatory Visit: Payer: Self-pay | Admitting: Obstetrics & Gynecology

## 2015-07-13 DIAGNOSIS — O2623 Pregnancy care for patient with recurrent pregnancy loss, third trimester: Secondary | ICD-10-CM | POA: Diagnosis not present

## 2015-07-13 DIAGNOSIS — O34219 Maternal care for unspecified type scar from previous cesarean delivery: Secondary | ICD-10-CM | POA: Diagnosis not present

## 2015-07-13 DIAGNOSIS — O2620 Pregnancy care for patient with recurrent pregnancy loss, unspecified trimester: Secondary | ICD-10-CM

## 2015-07-13 DIAGNOSIS — O3433 Maternal care for cervical incompetence, third trimester: Secondary | ICD-10-CM

## 2015-07-13 DIAGNOSIS — Z3A37 37 weeks gestation of pregnancy: Secondary | ICD-10-CM

## 2015-07-13 DIAGNOSIS — O09523 Supervision of elderly multigravida, third trimester: Secondary | ICD-10-CM | POA: Diagnosis not present

## 2015-07-13 DIAGNOSIS — O0993 Supervision of high risk pregnancy, unspecified, third trimester: Secondary | ICD-10-CM

## 2015-07-13 DIAGNOSIS — O2693 Pregnancy related conditions, unspecified, third trimester: Secondary | ICD-10-CM | POA: Diagnosis not present

## 2015-07-13 DIAGNOSIS — O2690 Pregnancy related conditions, unspecified, unspecified trimester: Secondary | ICD-10-CM

## 2015-07-18 ENCOUNTER — Inpatient Hospital Stay (HOSPITAL_COMMUNITY)
Admission: AD | Admit: 2015-07-18 | Discharge: 2015-07-18 | Disposition: A | Discharge status: Home / Self Care | Payer: Medicaid Other | Source: Ambulatory Visit | Attending: Family Medicine | Admitting: Family Medicine

## 2015-07-18 ENCOUNTER — Inpatient Hospital Stay (HOSPITAL_COMMUNITY): Payer: Medicaid Other

## 2015-07-18 ENCOUNTER — Encounter (HOSPITAL_COMMUNITY): Payer: Self-pay | Admitting: *Deleted

## 2015-07-18 DIAGNOSIS — O09529 Supervision of elderly multigravida, unspecified trimester: Secondary | ICD-10-CM

## 2015-07-18 DIAGNOSIS — D6859 Other primary thrombophilia: Secondary | ICD-10-CM | POA: Insufficient documentation

## 2015-07-18 DIAGNOSIS — Z7901 Long term (current) use of anticoagulants: Secondary | ICD-10-CM | POA: Insufficient documentation

## 2015-07-18 DIAGNOSIS — Z7982 Long term (current) use of aspirin: Secondary | ICD-10-CM | POA: Diagnosis not present

## 2015-07-18 DIAGNOSIS — O99113 Other diseases of the blood and blood-forming organs and certain disorders involving the immune mechanism complicating pregnancy, third trimester: Secondary | ICD-10-CM | POA: Diagnosis not present

## 2015-07-18 DIAGNOSIS — O26893 Other specified pregnancy related conditions, third trimester: Secondary | ICD-10-CM | POA: Insufficient documentation

## 2015-07-18 DIAGNOSIS — Z3A38 38 weeks gestation of pregnancy: Secondary | ICD-10-CM | POA: Diagnosis not present

## 2015-07-18 DIAGNOSIS — R748 Abnormal levels of other serum enzymes: Secondary | ICD-10-CM | POA: Diagnosis not present

## 2015-07-18 LAB — COMPREHENSIVE METABOLIC PANEL
ALT: 73 U/L — AB (ref 14–54)
AST: 52 U/L — AB (ref 15–41)
Albumin: 2.7 g/dL — ABNORMAL LOW (ref 3.5–5.0)
Alkaline Phosphatase: 149 U/L — ABNORMAL HIGH (ref 38–126)
Anion gap: 9 (ref 5–15)
BUN: 15 mg/dL (ref 6–20)
CHLORIDE: 104 mmol/L (ref 101–111)
CO2: 19 mmol/L — AB (ref 22–32)
CREATININE: 0.52 mg/dL (ref 0.44–1.00)
Calcium: 9.1 mg/dL (ref 8.9–10.3)
GFR calc Af Amer: >60 mL/min (ref >60)
GFR calc non Af Amer: >60 mL/min (ref >60)
Glucose, Bld: 100 mg/dL — ABNORMAL HIGH (ref 65–99)
Potassium: 3.8 mmol/L (ref 3.5–5.1)
SODIUM: 132 mmol/L — AB (ref 135–145)
Total Bilirubin: 0.7 mg/dL (ref 0.3–1.2)
Total Protein: 7 g/dL (ref 6.5–8.1)

## 2015-07-18 LAB — URINALYSIS, ROUTINE W REFLEX MICROSCOPIC
Bilirubin Urine: NEGATIVE
Glucose, UA: NEGATIVE mg/dL
Ketones, ur: NEGATIVE mg/dL
Nitrite: NEGATIVE
Protein, ur: NEGATIVE mg/dL
Specific Gravity, Urine: 1.025 (ref 1.005–1.030)
pH: 6 (ref 5.0–8.0)

## 2015-07-18 LAB — URINE MICROSCOPIC-ADD ON: RBC / HPF: NONE SEEN RBC/hpf (ref 0–5)

## 2015-07-18 LAB — CBC
HEMATOCRIT: 32.6 % — AB (ref 36.0–46.0)
Hemoglobin: 10.7 g/dL — ABNORMAL LOW (ref 12.0–15.0)
MCH: 29.3 pg (ref 26.0–34.0)
MCHC: 32.8 g/dL (ref 30.0–36.0)
MCV: 89.3 fL (ref 78.0–100.0)
Platelets: 226 10*3/uL (ref 150–400)
RBC: 3.65 MIL/uL — ABNORMAL LOW (ref 3.87–5.11)
RDW: 14.5 % (ref 11.5–15.5)
WBC: 10.8 10*3/uL — ABNORMAL HIGH (ref 4.0–10.5)

## 2015-07-18 MED ORDER — DIPHENHYDRAMINE HCL 25 MG PO CAPS
25.0000 mg | ORAL_CAPSULE | Freq: Four times a day (QID) | ORAL | Status: DC | PRN
  Administered 2015-07-18: 25 mg via ORAL
  Filled 2015-07-18: qty 1

## 2015-07-18 MED ORDER — URSODIOL 300 MG PO CAPS: 300.0000 mg | ORAL_CAPSULE | Freq: Three times a day (TID) | ORAL | Status: DC

## 2015-07-18 NOTE — Progress Notes (Signed)
Pt not aware of ctxs

## 2015-07-18 NOTE — MAU Note (Addendum)
Unable to sleep. Have a lot of itching for couple wks, esp palms and bottom of feet. Numbness R arm for a wk which comes and goes and unable to use R hand like usual. (Pt moves hand well in Triage while describing sypmptoms). Denies vag bleeding or d/c.

## 2015-07-18 NOTE — Progress Notes (Signed)
Dr Jaquita RectorMelancon notified of pt's admission and status. Will see pt

## 2015-07-18 NOTE — MAU Provider Note (Signed)
None     Chief Complaint:  Pruritis; Numbness; and Insomnia   Elizabeth Sanford is  34 y.o. [id number] at 4330w3d presents complaining of Pruritis; Numbness; and Insomnia .  She states none contractions are associated with none vaginal bleeding, intact membranes, along with active fetal movement. She comes in this am because she has been having itching all over and on her hands and feet for the past two weeks. She is very concerned that this is intrahepatic cholestasis of pregnancy. She denies headache, vision changes, RUQ pain, LE edema. She says she has felt miserable for the past two weeks. She has had difficulty sleeping due to the itching. She denies nausea, vomiting, diarrhea, or constipation.   Pregnancy Complicated By:  - Protein C/S deficiency - Lovenox requirement daily.  - History of multiple SAB  Sono:  239w5d u/s > 90% EFW. At risk for LGA.   Obstetrical/Gynecological History: OB History    Gravida Para Term Preterm AB TAB SAB Ectopic Multiple Living   12 1 1  10  10   1      Past Medical History: Past Medical History  Diagnosis Date  . Protein S deficiency (HCC)   . Protein C deficiency [facility](HCC)     Past Surgical History: Past Surgical History  Procedure Laterality Date  . Cesarean section    . Laparascopy    . Cervical cerclage  01/05/15    Family History: Family History  Problem Relation Age of Onset  . Hypertension Mother   . Diabetes Mother   . Hypertension Father     Social History: Social History  Substance Use Topics  . Smoking status: Never Smoker   . Smokeless tobacco: Never Used  . Alcohol Use: No    Allergies: No Known Allergies  Meds:  Prescriptions prior to admission  Medication Sig Dispense Refill Last Dose  . enoxaparin (LOVENOX) 40 MG/0.4ML injection Inject 0.4 mLs (40 mg total) into the skin daily. 30 Syringe 5 07/16/2015 at 12  . Omega 3 1000 MG CAPS Take 1 capsule by mouth daily.    Past Week at Unknown time  . pantoprazole  (PROTONIX) 40 MG tablet Take 1 tablet (40 mg total) by mouth daily. 30 tablet 2 07/17/2015 at Unknown time  . Prenatal Vit-Fe Fumarate-FA (PRENATAL MULTIVITAMIN) TABS tablet Take 1 tablet by mouth daily at 12 noon.   07/17/2015 at Unknown time  . aspirin 81 MG tablet Take 81 mg by mouth daily.   More than a month at Unknown time    Review of Systems -   Review of Systems  Constitutional: Negative for fever, chills, weight loss, malaise/fatigue and diaphoresis.  HENT: Negative for hearing loss, ear pain, nosebleeds, congestion, sore throat, neck pain, tinnitus and ear discharge.   Eyes: Negative for blurred vision, double vision, photophobia, pain, discharge and redness.  Respiratory: Negative for cough, hemoptysis, sputum production, shortness of breath, wheezing and stridor.   Cardiovascular: Negative for chest pain, palpitations, orthopnea,  leg swelling  Gastrointestinal: Negative for abdominal pain heartburn, nausea, vomiting, diarrhea, constipation, blood in stool Genitourinary: Negative for dysuria, urgency, frequency, hematuria and flank pain.  Musculoskeletal: Negative for myalgias, back pain, joint pain and falls.  Skin: Negative for itching and rash.  Neurological: Negative for dizziness, tingling, tremors, sensory change, speech change, focal weakness, seizures, loss of consciousness, weakness and headaches.  Endo/Heme/Allergies: Negative for environmental allergies and polydipsia. Does not bruise/bleed easily.  Psychiatric/Behavioral: Negative for depression, suicidal ideas, hallucinations, memory loss and substance abuse. The  patient is not nervous/anxious and does not have insomnia.      Physical Exam  Blood pressure 130/73, pulse 98, temperature 98 F (36.7 C), height  (1.626 m), weight 95.8 kg (211 lb 3.2 oz), last menstrual period 10/22/2014. GENERAL: Well-developed, well-nourished female in no acute distress.  LUNGS: Clear to auscultation bilaterally.  HEART:  Regular rate and rhythm. ABDOMEN: Soft, nontender, nondistended, gravid.  EXTREMITIES: Nontender, no edema, 2+ distal pulses. CERVICAL EXAM: deferred.  FHT:  Baseline rate 130 bpm   Variability moderate  Accelerations present   Decelerations none Contractions: Every 12 mins not feeling them.    Labs: Results for orders placed or performed during the hospital encounter of 07/18/15 (from the past 24 hour(s))  Urinalysis, Routine w reflex microscopic (not at [facility])   Collection Time: 07/18/15  4:20 AM  Result Value Ref Range   Color, Urine YELLOW YELLOW   APPearance CLEAR CLEAR   Specific Gravity, Urine 1.025 1.005 - 1.030   pH 6.0 5.0 - 8.0   Glucose, UA NEGATIVE NEGATIVE mg/dL   Hgb urine dipstick TRACE (A) NEGATIVE   Bilirubin Urine NEGATIVE NEGATIVE   Ketones, ur NEGATIVE NEGATIVE mg/dL   Protein, ur NEGATIVE NEGATIVE mg/dL   Nitrite NEGATIVE NEGATIVE   Leukocytes, UA TRACE (A) NEGATIVE  Urine microscopic-add on   Collection Time: 07/18/15  4:20 AM  Result Value Ref Range   Squamous Epithelial / LPF 6-30 (A) NONE SEEN   WBC, UA 0-5 0 - 5 WBC/hpf   RBC / HPF NONE SEEN 0 - 5 RBC/hpf   Bacteria, UA FEW (A) NONE SEEN   Urine-Other MUCOUS PRESENT    Imaging Studies:  [place] [facility] Ob Follow Up  07/13/2015  OBSTETRICAL ULTRASOUND: This exam was performed within a [facility]. The OB US report was generated in the AS system, and faxed to the ordering physician.  This report is available in the [employer]. See the AS Obstetric US report via the Image Link.   Assessment: Elizabeth Sanford is  34 y.o. [id number] with hx of Protein C/S deficiency, chronically anticoagulated at [redacted]w[redacted]d presents with itching all over / hands and feet.  Plan: - discussed with CNM Elenora Fender - will check CMP, CBC, Bile Acids.  - LFT's elevated with elevated ALP as well.  - Discussed with Dr. Shawnie Pons. Getting AFI.  - Care handed off to [facility].   Caleb Melancon 11/26/20165:24 AM  [phone] Pt  reassessed; reviewed HPI.  FHR 120's, +accels.    CMP Latest Ref Rng 07/18/2015  Glucose 65 - 99 mg/dL 119(J)  BUN 6 - 20 mg/dL 15  Creatinine 4.78 - 2.95 mg/dL 6.21  Sodium 308 - 657 mmol/L 132(L)  Potassium 3.5 - 5.1 mmol/L 3.8  Chloride 101 - 111 mmol/L 104  CO2 22 - 32 mmol/L 19(L)  Calcium 8.9 - 10.3 mg/dL 9.1  Total Protein 6.5 - 8.1 g/dL 7.0  Total Bilirubin 0.3 - 1.2 mg/dL 0.7  Alkaline Phos 38 - 126 U/L 149(H)  AST 15 - 41 U/L 52(H)  ALT 14 - 54 U/L 73(H)   0830 AFI 18; consulted with Dr. Shawnie Pons > discharge home with follow-up on Monday, NST at that visit.  Last Lovenox dose on Sunday AM;  Bile acids should be available at that time to decide plan of care.  75 Consulted with Dr. Debroah Loop regarding possible meds > begin ursodiol TID  A: 34 y.o. [id number] at [redacted]w[redacted]d IUP  Itching Elevated liver enzymes Protein C deficiency  Protein S deficiency Reactive NST  P: Discharge to home Reviewed fetal kick count and monitoring > return to MAU for decreased movement Bile acids pending RX ursodiol TID Follow-up in [facility] on Monday > obtain NST  Marlis Edelson, CNM

## 2015-07-18 NOTE — Progress Notes (Signed)
OK to d/c efm per Dr Jaquita RectorMelancon

## 2015-07-20 ENCOUNTER — Encounter: Payer: Self-pay | Admitting: Obstetrics and Gynecology

## 2015-07-20 ENCOUNTER — Encounter (HOSPITAL_COMMUNITY): Payer: Self-pay

## 2015-07-20 ENCOUNTER — Ambulatory Visit (INDEPENDENT_AMBULATORY_CARE_PROVIDER_SITE_OTHER): Payer: Self-pay | Admitting: Obstetrics and Gynecology

## 2015-07-20 ENCOUNTER — Inpatient Hospital Stay (HOSPITAL_COMMUNITY)
Admission: AD | Admit: 2015-07-20 | Discharge: 2015-07-23 | DRG: 765 | Disposition: A | Discharge status: Home / Self Care | Payer: Medicaid Other | Source: Ambulatory Visit | Attending: Family Medicine | Admitting: Family Medicine

## 2015-07-20 VITALS — BP 115/71 | HR 89 | Temp 98.4°F | Wt 210.4 lb

## 2015-07-20 DIAGNOSIS — O3433 Maternal care for cervical incompetence, third trimester: Secondary | ICD-10-CM | POA: Diagnosis present

## 2015-07-20 DIAGNOSIS — O26613 Liver and biliary tract disorders in pregnancy, third trimester: Principal | ICD-10-CM | POA: Diagnosis present

## 2015-07-20 DIAGNOSIS — O09523 Supervision of elderly multigravida, third trimester: Secondary | ICD-10-CM

## 2015-07-20 DIAGNOSIS — K831 Obstruction of bile duct: Secondary | ICD-10-CM | POA: Diagnosis present

## 2015-07-20 DIAGNOSIS — O99119 Other diseases of the blood and blood-forming organs and certain disorders involving the immune mechanism complicating pregnancy, unspecified trimester: Secondary | ICD-10-CM

## 2015-07-20 DIAGNOSIS — O0993 Supervision of high risk pregnancy, unspecified, third trimester: Secondary | ICD-10-CM

## 2015-07-20 DIAGNOSIS — Z3A38 38 weeks gestation of pregnancy: Secondary | ICD-10-CM

## 2015-07-20 DIAGNOSIS — D6859 Other primary thrombophilia: Secondary | ICD-10-CM

## 2015-07-20 DIAGNOSIS — Z3490 Encounter for supervision of normal pregnancy, unspecified, unspecified trimester: Secondary | ICD-10-CM

## 2015-07-20 DIAGNOSIS — O2623 Pregnancy care for patient with recurrent pregnancy loss, third trimester: Secondary | ICD-10-CM

## 2015-07-20 DIAGNOSIS — O34211 Maternal care for low transverse scar from previous cesarean delivery: Secondary | ICD-10-CM | POA: Diagnosis present

## 2015-07-20 DIAGNOSIS — O9912 Other diseases of the blood and blood-forming organs and certain disorders involving the immune mechanism complicating childbirth: Secondary | ICD-10-CM | POA: Diagnosis present

## 2015-07-20 DIAGNOSIS — Z7901 Long term (current) use of anticoagulants: Secondary | ICD-10-CM

## 2015-07-20 DIAGNOSIS — O3663X0 Maternal care for excessive fetal growth, third trimester, not applicable or unspecified: Secondary | ICD-10-CM | POA: Diagnosis present

## 2015-07-20 DIAGNOSIS — Z98891 History of uterine scar from previous surgery: Secondary | ICD-10-CM

## 2015-07-20 LAB — POCT URINALYSIS DIP (DEVICE)
BILIRUBIN URINE: NEGATIVE
GLUCOSE, UA: NEGATIVE mg/dL
Ketones, ur: NEGATIVE mg/dL
Nitrite: NEGATIVE
Protein, ur: NEGATIVE mg/dL
Specific Gravity, Urine: 1.02 (ref 1.005–1.030)
UROBILINOGEN UA: 0.2 mg/dL (ref 0.0–1.0)
pH: 5.5 (ref 5.0–8.0)

## 2015-07-20 LAB — CBC
HCT: 33.4 % — ABNORMAL LOW (ref 36.0–46.0)
HEMOGLOBIN: 10.8 g/dL — AB (ref 12.0–15.0)
MCH: 29.1 pg (ref 26.0–34.0)
MCHC: 32.3 g/dL (ref 30.0–36.0)
MCV: 90 fL (ref 78.0–100.0)
Platelets: 207 10*3/uL (ref 150–400)
RBC: 3.71 MIL/uL — AB (ref 3.87–5.11)
RDW: 14.5 % (ref 11.5–15.5)
WBC: 10.5 10*3/uL (ref 4.0–10.5)

## 2015-07-20 LAB — PROTIME-INR
INR: 0.89 (ref 0.00–1.49)
PROTHROMBIN TIME: 12.3 s (ref 11.6–15.2)

## 2015-07-20 LAB — APTT: APTT: 31 s (ref 24–37)

## 2015-07-20 LAB — BILE ACIDS, TOTAL: BILE ACIDS TOTAL: 51.5 umol/L — AB (ref 4.7–24.5)

## 2015-07-20 MED ORDER — ONDANSETRON HCL 4 MG/2ML IJ SOLN
4.0000 mg | Freq: Four times a day (QID) | INTRAMUSCULAR | Status: DC | PRN
  Administered 2015-07-21: 4 mg via INTRAVENOUS

## 2015-07-20 MED ORDER — LACTATED RINGERS IV SOLN: 500.0000 mL | INTRAVENOUS | Status: DC | PRN

## 2015-07-20 MED ORDER — ACETAMINOPHEN 325 MG PO TABS: 650.0000 mg | ORAL_TABLET | ORAL | Status: DC | PRN

## 2015-07-20 MED ORDER — CITRIC ACID-SODIUM CITRATE 334-500 MG/5ML PO SOLN
30.0000 mL | ORAL | Status: DC | PRN
  Administered 2015-07-21: 30 mL via ORAL
  Filled 2015-07-20: qty 15

## 2015-07-20 MED ORDER — OXYCODONE-ACETAMINOPHEN 5-325 MG PO TABS
2.0000 | ORAL_TABLET | ORAL | Status: DC | PRN
Start: 2015-07-20 — End: 2015-07-21

## 2015-07-20 MED ORDER — OXYTOCIN BOLUS FROM INFUSION: 500.0000 mL | INTRAVENOUS | Status: DC

## 2015-07-20 MED ORDER — OXYCODONE-ACETAMINOPHEN 5-325 MG PO TABS: 1.0000 | ORAL_TABLET | ORAL | Status: DC | PRN

## 2015-07-20 MED ORDER — LACTATED RINGERS IV SOLN
INTRAVENOUS | Status: DC
  Administered 2015-07-21 (×3): via INTRAVENOUS

## 2015-07-20 MED ORDER — OXYTOCIN 40 UNITS IN LACTATED RINGERS INFUSION - SIMPLE MED: 62.5000 mL/h | INTRAVENOUS | Status: DC

## 2015-07-20 MED ORDER — LIDOCAINE HCL (PF) 1 % IJ SOLN: 30.0000 mL | INTRAMUSCULAR | Status: DC | PRN

## 2015-07-20 NOTE — H&P (Signed)
LABOR AND DELIVERY ADMISSION HISTORY AND PHYSICAL NOTE  Jeny Grieshop is a 34 y.o. female [id number] with IUP at [redacted]w[redacted]d by L/24 presenting for IOL 2/2 cholestasis of pregnancy. She reports +FMs, No LOF, no VB, no blurry vision, headaches or peripheral edema, and RUQ pain.   Endorses pruritus hands/feet.  Prenatal History/Complications:  Past Medical History: Past Medical History  Diagnosis Date  . Protein S deficiency (HCC)   . Protein C deficiency [facility])     Past Surgical History: Past Surgical History  Procedure Laterality Date  . Cesarean section    . Laparascopy    . Cervical cerclage  01/05/15    Obstetrical History: OB History    Gravida Para Term Preterm AB TAB SAB Ectopic Multiple Living   Social History: Social History   Social History  . Marital Status: Married    Spouse Name: N/A  . Number of Children: N/A  . Years of Education: N/A   Social History Main Topics  . Smoking status: Never Smoker   . Smokeless tobacco: Never Used  . Alcohol Use: No  . Drug Use: No  . Sexual Activity: Yes    Birth Control/ Protection: None   Other Topics Concern  . None   Social History Narrative    Family History: Family History  Problem Relation Age of Onset  . Hypertension Mother   . Diabetes Mother   . Hypertension Father     Allergies: No Known Allergies  Prescriptions prior to admission  Medication Sig Dispense Refill Last Dose  . acetaminophen (TYLENOL) 500 MG tablet Take 1,000 mg by mouth every 6 (six) hours as needed for moderate pain.   Past Month at Unknown time  . loratadine (CLARITIN) 10 MG tablet Take 10 mg by mouth daily.   Past Week at Unknown time  . Prenatal Vit-Fe Fumarate-FA (PRENATAL MULTIVITAMIN) TABS tablet Take 1 tablet by mouth daily at 12 noon.   07/19/2015 at Unknown time  . ursodiol (ACTIGALL) 300 MG capsule Take 1 capsule (300 mg total) by mouth 3 (three) times daily. 90 capsule 1 07/20/2015 at 1800  .  enoxaparin (LOVENOX) 40 MG/0.4ML injection Inject 0.4 mLs (40 mg total) into the skin daily. 30 Syringe 5 07/20/2015 at 1130     Review of Systems   All systems reviewed and negative except as stated in HPI  Blood pressure 123/73, pulse 97, temperature 98.8 F (37.1 C), temperature source Oral, resp. rate 16, height  (1.626 m), weight 210 lb (95.255 kg), last menstrual period 10/22/2014. General appearance: alert, cooperative and appears stated age Lungs: clear to auscultation bilaterally Heart: regular rate and rhythm Abdomen: soft, non-tender; bowel sounds normal Extremities: No calf swelling or tenderness Presentation: cephalic Fetal monitoring: 145/mod/+a/-d Uterine activity: q 5-10 minutes  Dilation: Closed Effacement (%): Thick Exam by:: Alfonso Patten, MD   Prenatal labs: ABO, Rh: --/--/B POS (11/28 2030) Antibody: NEG (11/28 2030) Rubella: !Error!immune RPR: NON REAC (08/11 1726)  HBsAg: NEGATIVE (08/11 1726)  HIV: NONREACTIVE (08/11 1726)  GBS: Negative (11/17 0000)  1 hr Glucola 115 Genetic screening  Too late Anatomy US wnl  Prenatal Transfer Tool  Maternal Diabetes: No Genetic Screening: Declined Maternal Ultrasounds/Referrals: Normal Fetal Ultrasounds or other Referrals:  None Maternal Substance Abuse:  No Significant Maternal Medications:  Meds include: Other: lovenox, ursodiol Significant Maternal Lab Results: Lab values include: Group B Strep negative  Results for orders placed  or performed during the hospital encounter of 07/20/15 (from the past 24 hour(s))  APTT   Collection Time: 07/20/15  8:30 PM  Result Value Ref Range   aPTT 31 24 - 37 seconds  Protime-INR   Collection Time: 07/20/15  8:30 PM  Result Value Ref Range   Prothrombin Time 12.3 11.6 - 15.2 seconds   INR 0.89 0.00 - 1.49  [facility] and screen [facility]   Collection Time: 07/20/15  8:30 PM  Result Value Ref Range   ABO/RH(D) B POS    Antibody Screen NEG    Sample  Expiration 07/23/2015   CBC   Collection Time: 07/20/15 10:03 PM  Result Value Ref Range   WBC 10.5 4.0 - 10.5 K/uL   RBC 3.71 (L) 3.87 - 5.11 MIL/uL   Hemoglobin 10.8 (L) 12.0 - 15.0 g/dL   HCT 78.233.4 (L) 95.636.0 - 21.346.0 %   MCV 90.0 78.0 - 100.0 fL   MCH 29.1 26.0 - 34.0 pg   MCHC 32.3 30.0 - 36.0 g/dL   RDW 08.614.5 57.811.5 - 46.915.5 %   Platelets 207 150 - 400 K/uL  Results for orders placed or performed in visit on 07/20/15 (from the past 24 hour(s))  POCT urinalysis dip (device)   Collection Time: 07/20/15  8:23 AM  Result Value Ref Range   Glucose, UA NEGATIVE NEGATIVE mg/dL   Bilirubin Urine NEGATIVE NEGATIVE   Ketones, ur NEGATIVE NEGATIVE mg/dL   Specific Gravity, Urine 1.020 1.005 - 1.030   Hgb urine dipstick TRACE (A) NEGATIVE   pH 5.5 5.0 - 8.0   Protein, ur NEGATIVE NEGATIVE mg/dL   Urobilinogen, UA 0.2 0.0 - 1.0 mg/dL   Nitrite NEGATIVE NEGATIVE   Leukocytes, UA TRACE (A) NEGATIVE    Patient Active Problem List   Diagnosis Date Noted  . Pregnancy 07/20/2015  . Elevated liver enzymes 07/18/2015  . AMA (advanced maternal age) multigravida 35+ 04/10/2015  . Protein C deficiency affecting pregnancy (HCC) 04/02/2015  . Protein S deficiency (HCC) 04/02/2015  . Recurrent pregnancy loss with current pregnancy 04/02/2015  . Previous cesarean section 04/02/2015  . Cervical cerclage suture present 04/02/2015  . Supervision of high-risk pregnancy 03/11/2015    Assessment: Shamecka Oneta Racklbosraty is a 34 y.o. [id number] at 8253w5d here for iol 2/2 cholestasis  #Labor/history c/s: discussed induction with patient, she became quite anxious. She first stated she definitely wanted a c/s; now she says she wants to discuss with her husband. I discussed relative risks of tolac vs planned c/s.  #Pain: Epidural or spinal #FWB: Cat 1 #ID:  gbs negative #MOF: breast #MOC: OCPs #Circ:  Yes, outpatient #Cholestasis: recent dx, bile acids 51.5. On ursodiol, continuing. #History recurrent miscarriage,  protein c/s deficiency: on low-dose lovenox, last dose 11 AM today (11/28). Will need to wait 24 hours for neuraxial anesthesia #Cervical insufficiency: had cerclage this pregnancy, removed at 37 weeks #Macrosomia: efw 4049 g, > 90th percentile, at 37+5  Silvano Bilisoah B Wallie Lagrand 07/20/2015, 10:45 PM

## 2015-07-20 NOTE — Progress Notes (Signed)
Subjective:  Elizabeth Sanford is a 34 y.o. [id number] at 4157w5d being seen today for ongoing prenatal care.  She is currently monitored for the following issues for this high-risk pregnancy and has Supervision of high-risk pregnancy; Protein C deficiency affecting pregnancy (HCC); Protein S deficiency (HCC); Recurrent pregnancy loss with current pregnancy; Previous cesarean section; Cervical cerclage suture present; AMA (advanced maternal age) multigravida 35+; and Elevated liver enzymes on her problem list.  Patient reports continued pruritus.  Contractions: Not present. Vag. Bleeding: None.  Movement: Present. Denies leaking of fluid.   The following portions of the patient's history were reviewed and updated as appropriate: allergies, current medications, past family history, past medical history, past social history, past surgical history and problem list. Problem list updated.  Objective:   Filed Vitals:   07/20/15 0830  BP: 115/71  Pulse: 89  Temp: 98.4 F (36.9 C)  Weight: 210 lb 6.4 oz (95.437 kg)    Fetal Status: Fetal Heart Rate (bpm): 143   Movement: Present     General:  Alert, oriented and cooperative. Patient is in no acute distress.  Skin: Skin is warm and dry. No rash noted.   Cardiovascular: Normal heart rate noted  Respiratory: Normal respiratory effort, no problems with respiration noted  Abdomen: Soft, gravid, appropriate for gestational age. Pain/Pressure: Present     Pelvic: Vag. Bleeding: None     Cervical exam deferred        Extremities: Normal range of motion.  Edema: None  Mental Status: Normal mood and affect. Normal behavior. Normal judgment and thought content.   Urinalysis: Urine Protein: Negative Urine Glucose: Negative  Assessment and Plan:  Pregnancy: [id number] at 7957w5d  1. Supervision of high-risk pregnancy, third trimester Patient with continued pruritis currently on Actigall Bile acids results remain unavailable- lab contacted who states that  results should be available this evening or tomorrow morning. Patient will be contacted as soon as results are available with plan of care NST reviewed and reactive  2. Recurrent pregnancy loss with current pregnancy, third trimester   3. Protein S deficiency (HCC) Continue lovenox  4. Protein C deficiency affecting pregnancy (HCC)   5. Previous cesarean section Patient desires TOLAC  6. AMA (advanced maternal age) multigravida 35+, third trimester   Term labor symptoms and general obstetric precautions including but not limited to vaginal bleeding, contractions, leaking of fluid and fetal movement were reviewed in detail with the patient. Please refer to After Visit Summary for other counseling recommendations.  Return in about 1 week (around 07/27/2015).   Catalina AntiguaPeggy Maverick Patman, MD

## 2015-07-20 NOTE — Progress Notes (Signed)
Interpreter  Pressure- pelvic

## 2015-07-21 ENCOUNTER — Encounter (HOSPITAL_COMMUNITY)
Admission: AD | Disposition: A | Discharge status: Home / Self Care | Payer: Self-pay | Source: Ambulatory Visit | Attending: Family Medicine

## 2015-07-21 ENCOUNTER — Encounter (HOSPITAL_COMMUNITY): Payer: Self-pay | Admitting: Registered Nurse

## 2015-07-21 ENCOUNTER — Inpatient Hospital Stay (HOSPITAL_COMMUNITY): Payer: Medicaid Other | Admitting: Anesthesiology

## 2015-07-21 DIAGNOSIS — O34211 Maternal care for low transverse scar from previous cesarean delivery: Secondary | ICD-10-CM

## 2015-07-21 DIAGNOSIS — O9962 Diseases of the digestive system complicating childbirth: Secondary | ICD-10-CM

## 2015-07-21 DIAGNOSIS — Z3A38 38 weeks gestation of pregnancy: Secondary | ICD-10-CM

## 2015-07-21 DIAGNOSIS — K8010 Calculus of gallbladder with chronic cholecystitis without obstruction: Secondary | ICD-10-CM

## 2015-07-21 LAB — RPR: RPR Ser Ql: NONREACTIVE

## 2015-07-21 LAB — ABO/RH: ABO/RH(D): B POS

## 2015-07-21 LAB — PREPARE RBC (CROSSMATCH)

## 2015-07-21 SURGERY — Surgical Case
Anesthesia: Choice

## 2015-07-21 SURGERY — Surgical Case
Anesthesia: Spinal

## 2015-07-21 MED ORDER — OXYTOCIN 10 UNIT/ML IJ SOLN
INTRAMUSCULAR | Status: AC
Start: 2015-07-21 — End: 2015-07-21
  Filled 2015-07-21: qty 4

## 2015-07-21 MED ORDER — MAGNESIUM HYDROXIDE 400 MG/5ML PO SUSP
30.0000 mL | ORAL | Status: DC | PRN
Start: 2015-07-21 — End: 2015-07-23

## 2015-07-21 MED ORDER — PHENYLEPHRINE 8 MG IN D5W 100 ML (0.08MG/ML) PREMIX OPTIME
INJECTION | INTRAVENOUS | Status: AC
  Filled 2015-07-21: qty 100

## 2015-07-21 MED ORDER — LANOLIN HYDROUS EX OINT: 1.0000 "application " | TOPICAL_OINTMENT | CUTANEOUS | Status: DC | PRN

## 2015-07-21 MED ORDER — IBUPROFEN 600 MG PO TABS
600.0000 mg | ORAL_TABLET | Freq: Four times a day (QID) | ORAL | Status: DC
  Administered 2015-07-23 (×3): 600 mg via ORAL
  Filled 2015-07-21 (×3): qty 1

## 2015-07-21 MED ORDER — MORPHINE SULFATE (PF) 0.5 MG/ML IJ SOLN
INTRAMUSCULAR | Status: DC | PRN
  Administered 2015-07-21: .2 mg via INTRATHECAL

## 2015-07-21 MED ORDER — CEFAZOLIN SODIUM-DEXTROSE 2-3 GM-% IV SOLR
2.0000 g | Freq: Once | INTRAVENOUS | Status: AC
  Administered 2015-07-21: 2 g via INTRAVENOUS
  Filled 2015-07-21: qty 50

## 2015-07-21 MED ORDER — DIBUCAINE 1 % RE OINT: 1.0000 "application " | TOPICAL_OINTMENT | RECTAL | Status: DC | PRN

## 2015-07-21 MED ORDER — NALBUPHINE HCL 10 MG/ML IJ SOLN
5.0000 mg | INTRAMUSCULAR | Status: DC | PRN
  Filled 2015-07-21: qty 1

## 2015-07-21 MED ORDER — WITCH HAZEL-GLYCERIN EX PADS: 1.0000 "application " | MEDICATED_PAD | CUTANEOUS | Status: DC | PRN

## 2015-07-21 MED ORDER — ONDANSETRON HCL 4 MG/2ML IJ SOLN
INTRAMUSCULAR | Status: AC
  Filled 2015-07-21: qty 2

## 2015-07-21 MED ORDER — FERROUS SULFATE 325 (65 FE) MG PO TABS
325.0000 mg | ORAL_TABLET | Freq: Two times a day (BID) | ORAL | Status: DC
  Administered 2015-07-21 – 2015-07-23 (×4): 325 mg via ORAL
  Filled 2015-07-21 (×4): qty 1

## 2015-07-21 MED ORDER — SCOPOLAMINE 1 MG/3DAYS TD PT72
MEDICATED_PATCH | TRANSDERMAL | Status: DC | PRN
  Administered 2015-07-21: 1 via TRANSDERMAL

## 2015-07-21 MED ORDER — MORPHINE SULFATE (PF) 0.5 MG/ML IJ SOLN
INTRAMUSCULAR | Status: AC
Start: 2015-07-21 — End: 2015-07-21
  Filled 2015-07-21: qty 10

## 2015-07-21 MED ORDER — OXYTOCIN 10 UNIT/ML IJ SOLN
40.0000 [IU] | INTRAVENOUS | Status: DC | PRN
  Administered 2015-07-21: 40 [IU] via INTRAVENOUS

## 2015-07-21 MED ORDER — FENTANYL CITRATE (PF) 100 MCG/2ML IJ SOLN
INTRAMUSCULAR | Status: AC
  Filled 2015-07-21: qty 2

## 2015-07-21 MED ORDER — LACTATED RINGERS IV SOLN
INTRAVENOUS | Status: DC
  Administered 2015-07-21 – 2015-07-22 (×3): via INTRAVENOUS

## 2015-07-21 MED ORDER — OXYCODONE-ACETAMINOPHEN 5-325 MG PO TABS
1.0000 | ORAL_TABLET | ORAL | Status: DC | PRN
  Administered 2015-07-21: 1 via ORAL
  Filled 2015-07-21 (×3): qty 1

## 2015-07-21 MED ORDER — FENTANYL CITRATE (PF) 100 MCG/2ML IJ SOLN
INTRAMUSCULAR | Status: DC | PRN
Start: 2015-07-21 — End: 2015-07-21
  Administered 2015-07-21: 10 ug via INTRATHECAL

## 2015-07-21 MED ORDER — PROMETHAZINE HCL 25 MG/ML IJ SOLN
6.2500 mg | INTRAMUSCULAR | Status: AC | PRN
  Administered 2015-07-21: 6.25 mg via INTRAVENOUS
  Administered 2015-07-21: 12.5 mg via INTRAVENOUS

## 2015-07-21 MED ORDER — SIMETHICONE 80 MG PO CHEW
80.0000 mg | CHEWABLE_TABLET | ORAL | Status: DC | PRN
  Administered 2015-07-21: 80 mg via ORAL

## 2015-07-21 MED ORDER — PRENATAL MULTIVITAMIN CH
1.0000 | ORAL_TABLET | Freq: Every day | ORAL | Status: DC
  Administered 2015-07-22 – 2015-07-23 (×2): 1 via ORAL
  Filled 2015-07-21 (×2): qty 1

## 2015-07-21 MED ORDER — NALBUPHINE HCL 10 MG/ML IJ SOLN: 5.0000 mg | Freq: Once | INTRAMUSCULAR | Status: AC | PRN

## 2015-07-21 MED ORDER — PHENYLEPHRINE 8 MG IN D5W 100 ML (0.08MG/ML) PREMIX OPTIME
INJECTION | INTRAVENOUS | Status: DC | PRN
  Administered 2015-07-21: 60 ug/min via INTRAVENOUS

## 2015-07-21 MED ORDER — TETANUS-DIPHTH-ACELL PERTUSSIS 5-2.5-18.5 LF-MCG/0.5 IM SUSP: 0.5000 mL | Freq: Once | INTRAMUSCULAR | Status: DC

## 2015-07-21 MED ORDER — ENOXAPARIN SODIUM 40 MG/0.4ML ~~LOC~~ SOLN
40.0000 mg | SUBCUTANEOUS | Status: DC
  Administered 2015-07-23: 40 mg via SUBCUTANEOUS
  Filled 2015-07-21 (×3): qty 0.4

## 2015-07-21 MED ORDER — ONDANSETRON HCL 4 MG/2ML IJ SOLN
4.0000 mg | Freq: Four times a day (QID) | INTRAMUSCULAR | Status: DC | PRN
  Administered 2015-07-21: 4 mg via INTRAVENOUS
  Filled 2015-07-21: qty 2

## 2015-07-21 MED ORDER — DIPHENHYDRAMINE HCL 25 MG PO CAPS: 25.0000 mg | ORAL_CAPSULE | Freq: Four times a day (QID) | ORAL | Status: DC | PRN

## 2015-07-21 MED ORDER — LORATADINE 10 MG PO TABS
10.0000 mg | ORAL_TABLET | Freq: Every day | ORAL | Status: DC
  Administered 2015-07-23: 10 mg via ORAL
  Filled 2015-07-21: qty 1

## 2015-07-21 MED ORDER — MORPHINE SULFATE (PF) 0.5 MG/ML IJ SOLN: INTRAMUSCULAR | Status: DC | PRN

## 2015-07-21 MED ORDER — SCOPOLAMINE 1 MG/3DAYS TD PT72
MEDICATED_PATCH | TRANSDERMAL | Status: AC
  Filled 2015-07-21: qty 1

## 2015-07-21 MED ORDER — PROMETHAZINE HCL 25 MG/ML IJ SOLN
INTRAMUSCULAR | Status: AC
  Filled 2015-07-21: qty 1

## 2015-07-21 MED ORDER — NALBUPHINE HCL 10 MG/ML IJ SOLN: 5.0000 mg | INTRAMUSCULAR | Status: DC | PRN

## 2015-07-21 MED ORDER — SENNOSIDES-DOCUSATE SODIUM 8.6-50 MG PO TABS
2.0000 | ORAL_TABLET | ORAL | Status: DC
  Administered 2015-07-21 – 2015-07-23 (×2): 2 via ORAL
  Filled 2015-07-21 (×2): qty 2

## 2015-07-21 MED ORDER — OXYTOCIN 40 UNITS IN LACTATED RINGERS INFUSION - SIMPLE MED: 62.5000 mL/h | INTRAVENOUS | Status: AC

## 2015-07-21 MED ORDER — OXYCODONE-ACETAMINOPHEN 5-325 MG PO TABS
2.0000 | ORAL_TABLET | ORAL | Status: DC | PRN
  Administered 2015-07-22 (×4): 2 via ORAL
  Filled 2015-07-21 (×3): qty 2

## 2015-07-21 MED ORDER — BUPIVACAINE HCL (PF) 0.5 % IJ SOLN
INTRAMUSCULAR | Status: AC
  Filled 2015-07-21: qty 30

## 2015-07-21 MED ORDER — MEASLES, MUMPS & RUBELLA VAC ~~LOC~~ INJ
0.5000 mL | INJECTION | Freq: Once | SUBCUTANEOUS | Status: DC
  Filled 2015-07-21: qty 0.5

## 2015-07-21 MED ORDER — NALBUPHINE HCL 10 MG/ML IJ SOLN
5.0000 mg | Freq: Once | INTRAMUSCULAR | Status: AC | PRN
  Administered 2015-07-21: 5 mg via INTRAVENOUS

## 2015-07-21 MED ORDER — MENTHOL 3 MG MT LOZG
1.0000 | LOZENGE | OROMUCOSAL | Status: DC | PRN
Start: 2015-07-21 — End: 2015-07-23

## 2015-07-21 MED ORDER — BUPIVACAINE HCL (PF) 0.5 % IJ SOLN
INTRAMUSCULAR | Status: DC | PRN
  Administered 2015-07-21: 20 mL

## 2015-07-21 MED ORDER — SIMETHICONE 80 MG PO CHEW
80.0000 mg | CHEWABLE_TABLET | ORAL | Status: DC
  Administered 2015-07-23: 80 mg via ORAL
  Filled 2015-07-21 (×2): qty 1

## 2015-07-21 MED ORDER — ZOLPIDEM TARTRATE 5 MG PO TABS: 5.0000 mg | ORAL_TABLET | Freq: Every evening | ORAL | Status: DC | PRN

## 2015-07-21 MED ORDER — BUPIVACAINE IN DEXTROSE 0.75-8.25 % IT SOLN
INTRATHECAL | Status: DC | PRN
  Administered 2015-07-21: 1.6 mg via INTRATHECAL

## 2015-07-21 MED ORDER — ACETAMINOPHEN 325 MG PO TABS
650.0000 mg | ORAL_TABLET | ORAL | Status: DC | PRN
  Administered 2015-07-21: 650 mg via ORAL
  Filled 2015-07-21 (×2): qty 2

## 2015-07-21 SURGICAL SUPPLY — 36 items
BENZOIN TINCTURE PRP APPL 2/3 (GAUZE/BANDAGES/DRESSINGS) ×3 IMPLANT
CLAMP CORD UMBIL (MISCELLANEOUS) IMPLANT
CLOSURE WOUND 1/2 X4 (GAUZE/BANDAGES/DRESSINGS) ×1
CLOTH BEACON ORANGE TIMEOUT ST (SAFETY) ×3 IMPLANT
DRAPE SHEET LG 3/4 BI-LAMINATE (DRAPES) IMPLANT
DRSG OPSITE POSTOP 3X4 (GAUZE/BANDAGES/DRESSINGS) ×3 IMPLANT
DRSG OPSITE POSTOP 4X10 (GAUZE/BANDAGES/DRESSINGS) ×3 IMPLANT
DURAPREP 26ML APPLICATOR (WOUND CARE) ×3 IMPLANT
ELECT REM PT RETURN 9FT ADLT (ELECTROSURGICAL) ×3
ELECTRODE REM PT RTRN 9FT ADLT (ELECTROSURGICAL) ×1 IMPLANT
EXTRACTOR VACUUM M CUP 4 TUBE (SUCTIONS) IMPLANT
EXTRACTOR VACUUM M CUP 4' TUBE (SUCTIONS)
GLOVE BIOGEL PI IND STRL 7.0 (GLOVE) ×3 IMPLANT
GLOVE BIOGEL PI INDICATOR 7.0 (GLOVE) ×6
GLOVE ECLIPSE 7.0 STRL STRAW (GLOVE) ×3 IMPLANT
GOWN STRL REUS W/TWL LRG LVL3 (GOWN DISPOSABLE) ×6 IMPLANT
KIT ABG SYR 3ML LUER SLIP (SYRINGE) IMPLANT
NEEDLE HYPO 22GX1.5 SAFETY (NEEDLE) ×3 IMPLANT
NEEDLE HYPO 25X5/8 SAFETYGLIDE (NEEDLE) ×3 IMPLANT
NS IRRIG 1000ML POUR BTL (IV SOLUTION) ×3 IMPLANT
PACK C SECTION WH (CUSTOM PROCEDURE TRAY) ×3 IMPLANT
PAD ABD 7.5X8 STRL (GAUZE/BANDAGES/DRESSINGS) ×3 IMPLANT
PAD OB MATERNITY 4.3X12.25 (PERSONAL CARE ITEMS) ×3 IMPLANT
PENCIL SMOKE EVAC W/HOLSTER (ELECTROSURGICAL) ×3 IMPLANT
RTRCTR C-SECT PINK 25CM LRG (MISCELLANEOUS) IMPLANT
STRIP CLOSURE SKIN 1/2X4 (GAUZE/BANDAGES/DRESSINGS) ×2 IMPLANT
SUT PDS AB 0 CT1 27 (SUTURE) IMPLANT
SUT PDS AB 0 CTX 36 PDP370T (SUTURE) IMPLANT
SUT PLAIN 2 0 XLH (SUTURE) IMPLANT
SUT VIC AB 0 CT1 36 (SUTURE) ×6 IMPLANT
SUT VIC AB 0 CTX 36 (SUTURE) ×4
SUT VIC AB 0 CTX36XBRD ANBCTRL (SUTURE) ×2 IMPLANT
SUT VIC AB 4-0 KS 27 (SUTURE) ×3 IMPLANT
SYR CONTROL 10ML LL (SYRINGE) ×3 IMPLANT
TOWEL OR 17X24 6PK STRL BLUE (TOWEL DISPOSABLE) ×3 IMPLANT
TRAY FOLEY CATH SILVER 14FR (SET/KITS/TRAYS/PACK) ×3 IMPLANT

## 2015-07-21 NOTE — Lactation Note (Signed)
This note was copied from the chart of Elizabeth Sanford. Lactation Consultation Note  Patient Name: Elizabeth Sanford Today's Date: 07/21/2015 Reason for consult: Initial assessment Baby at 7 hr of life and parents had lots of questions about feeding frequency, voids, positions, latching, and nipple care. Mom was able to demonstrate manual expression, colostrum noted bilaterally. She reports Lt nipple pain only when baby is eating. No skin break down noted at this time. Given lactation handouts. Mom is aware of OP services and support group. FOB was engaged but mom appeared very sleepy. More bf ed may be needed.   Maternal Data Has patient been taught Hand Expression?: Yes Does the patient have breastfeeding experience prior to this delivery?: Yes  Feeding Feeding Type: Breast Fed Length of feed: 20 min  LATCH Score/Interventions                      Lactation Tools Discussed/Used WIC Program: Yes   Consult Status Consult Status: Follow-up Date: 07/22/15 Follow-up type: In-patient    [name] 07/21/2015, 5:33 PM

## 2015-07-21 NOTE — Progress Notes (Signed)
Notified provider for clarification of contraindication of ibuprofen for maternal hx of protein c & s deficinecy and lovenox during pregnancy, instructed to give tylenol or percocet for pain control.

## 2015-07-21 NOTE — Addendum Note (Signed)
Addendum  created 07/21/15 1343 by Cecile HearingStephen Edward Turk, MD   Modules edited: Orders, PRL Based Order Sets

## 2015-07-21 NOTE — Transfer of Care (Signed)
Immediate Anesthesia Transfer of Care Note  Patient: Elizabeth Sanford  Procedure(s) Performed: Procedure(s): CESAREAN SECTION (N/A)  Patient Location: PACU  Anesthesia Type:Spinal  Level of Consciousness: awake, alert  and oriented  Airway & Oxygen Therapy: Patient Spontanous Breathing  Post-op Assessment: Report given to RN  Post vital signs: Reviewed  Last Vitals:  Filed Vitals:   07/21/15 0658 07/21/15 0832  BP: 122/68 115/58  Pulse: 90 93  Temp: 36.9 C 36.8 C  Resp: 16 18    Complications: No apparent anesthesia complications

## 2015-07-21 NOTE — Op Note (Signed)
[place] [place] PROCEDURE DATE: 07/21/2015  PREOPERATIVE DIAGNOSES: Intrauterine pregnancy at [redacted]w[redacted]d weeks gestation; previous cesarean section; cholestasis; declined TOLAC  POSTOPERATIVE DIAGNOSES: The same  PROCEDURE: Repeat Low Transverse Cesarean Section  SURGEON:  Dr. Jaynie Collins  ASSISTANT:  Dr. Lorain Childes  ANESTHESIOLOGIST: Dr. Arrie Aran  INDICATIONS: Elizabeth Sanford is a 34 y.o. [id number] at [redacted]w[redacted]d here for cesarean section secondary to the indications listed under preoperative diagnoses; please see preoperative note for further details.  The risks of cesarean section were discussed with the patient including but were not limited to: bleeding which may require transfusion or reoperation; infection which may require antibiotics; injury to bowel, bladder, ureters or other surrounding organs; injury to the fetus; need for additional procedures including hysterectomy in the event of a life-threatening hemorrhage; placental abnormalities wth subsequent pregnancies, incisional problems, thromboembolic phenomenon and other postoperative/anesthesia complications.   The patient concurred with the proposed plan, giving informed written consent for the procedure.    FINDINGS:  Viable female infant in cephalic presentation.  Apgars 8 and 9. Loose nuchal cord x 1. Copious amount of clear amniotic fluid.  Intact placenta, three vessel cord.  Normal uterus, fallopian tubes and ovaries bilaterally. Minimal intraperitoneal adhesive disease.  ANESTHESIA: Spinal INTRAVENOUS FLUIDS: 2700 ml ESTIMATED BLOOD LOSS: 800 ml URINE OUTPUT:  200 ml SPECIMENS: Placenta sent to L&D COMPLICATIONS: None immediate  PROCEDURE IN DETAIL:  The patient preoperatively received intravenous antibiotics and had sequential compression devices applied to her lower extremities.  She was then taken to the operating room where spinal anesthesia was administered and was found to be adequate. She was then placed in a  dorsal supine position with a leftward tilt, and prepped and draped in a sterile manner.  A foley catheter was placed into her bladder and attached to constant gravity.  After an adequate timeout was performed, a Pfannenstiel skin incision was made with scalpel and carried through to the underlying layer of fascia. The fascia was incised in the midline, and this incision was extended bilaterally using the Mayo scissors.  Kocher clamps were applied to the superior aspect of the fascial incision and the underlying rectus muscles were dissected off bluntly.  A similar process was carried out on the inferior aspect of the fascial incision. The rectus muscles were separated in the midline bluntly and the peritoneum was entered bluntly. Attention was turned to the lower uterine segment where a low transverse hysterotomy was made with a scalpel and extended bilaterally bluntly.  The infant was successfully delivered, the cord was clamped and cut after one minute, and the infant was handed over to the awaiting neonatology team. Uterine massage was then administered, and the placenta delivered intact with a three-vessel cord. The uterus was then cleared of clot and debris.  The hysterotomy was closed with 0 Vicryl in a running locked fashion, and an imbricating layer was also placed with 0 Vicryl.  Figure-of-eight 0 Vicryl serosal stitches were placed to help with hemostasis.  The pelvis was cleared of all clot and debris. Hemostasis was confirmed on all surfaces.  The peritoneum and the muscles were reapproximated using 0 Vicryl interrupted stitches. The fascia was then closed using 0 Vicryl in a running fashion.  The subcutaneous layer was irrigated, and 30 ml of 0.5% Marcaine was injected subcutaneously around the incision.  The skin was closed with a 4-0 Vicryl subcuticular stitch. The patient tolerated the procedure well. Sponge, lap, instrument and needle counts were correct x 3.  She was taken to  the recovery room in  stable condition.    Jaynie CollinsUGONNA  Lavonna Lampron, MD, FACOG Attending Obstetrician & Gynecologist Faculty Practice, [facility]

## 2015-07-21 NOTE — Interval H&P Note (Signed)
History and Physical Interval Note 07/21/2015 9:03 AM  Elizabeth Sanford decided not to proceed with IOL for cholestasis at 4216w6d as scheduled last night and desired ERLTCS.  This was scheduled for today.  Overnight, she was stable and had Category I FHR tracing.  No other preoperative concerns.  She has presented today for surgery, with the diagnosis of Previous Cesarean Section. The risks of repeat cesarean section discussed with the patient included but were not limited to: bleeding which may require transfusion or reoperation; infection which may require antibiotics; injury to bowel, bladder, ureters or other surrounding organs; injury to the fetus; need for additional procedures including hysterectomy in the event of a life-threatening hemorrhage; placental abnormalities wth subsequent pregnancies, incisional problems, thromboembolic phenomenon and other postoperative/anesthesia complications. The patient concurred with the proposed plan, giving informed written consent for the procedure.   The patient's history has been reviewed, patient examined, no change in status, stable for surgery.  I have reviewed the patient's chart and labs.  Questions were answered to the patient's satisfaction.  Patient has been NPO since last night she will remain NPO for procedure. She had her last dose of prophylactic Lovenox yesterday.  Anesthesia and OR aware. Preoperative prophylactic antibiotics and SCDs ordered on call to the OR.  To OR when ready.   Jaynie CollinsUGONNA  Jontae Sonier, MD, FACOG Attending Obstetrician & Gynecologist, [facility] Faculty Practice, [facility]

## 2015-07-21 NOTE — Anesthesia Procedure Notes (Signed)
Spinal Patient location during procedure: OR Start time: 07/21/2015 9:37 AM End time: 07/21/2015 9:49 AM Staffing Anesthesiologist: Cecile HearingURK, STEPHEN EDWARD Performed by: anesthesiologist  Preanesthetic Checklist Completed: patient identified, surgical consent, pre-op evaluation, timeout performed, IV checked, risks and benefits discussed and monitors and equipment checked Spinal Block Patient position: sitting Prep: site prepped and draped and DuraPrep Patient monitoring: continuous pulse ox and blood pressure Approach: midline Location: L3-4 Needle Needle type: Pencan  Needle gauge: 24 G Additional Notes Functioning IV was confirmed and monitors were applied. Sterile prep and drape, including hand hygiene, mask and sterile gloves were used. The patient was positioned and the spine was prepped. The skin was anesthetized with lidocaine.  Free flow of clear CSF was obtained prior to injecting local anesthetic into the CSF.  The spinal needle aspirated freely following injection.  The needle was carefully withdrawn.  The patient tolerated the procedure well. Consent was obtained prior to procedure with all questions answered and concerns addressed. Risks including but not limited to bleeding, infection, nerve damage, paralysis, failed block, inadequate analgesia, allergic reaction, high spinal, itching and headache were discussed and the patient wished to proceed.   Elizabeth AranStephen Turk, MD

## 2015-07-21 NOTE — Anesthesia Postprocedure Evaluation (Signed)
Anesthesia Post Note  Patient: Elizabeth Sanford  Procedure(s) Performed: Procedure(s) (LRB): CESAREAN SECTION (N/A)  Patient location during evaluation: PACU Anesthesia Type: Spinal Level of consciousness: oriented, awake and alert and awake Pain management: pain level controlled Vital Signs Assessment: post-procedure vital signs reviewed and stable Respiratory status: spontaneous breathing, respiratory function stable and patient connected to nasal cannula oxygen Cardiovascular status: blood pressure returned to baseline and stable Postop Assessment: no headache, no backache, spinal receding and patient able to bend at knees (Vomiting x1, improved with x1 phenergan dose) Anesthetic complications: no    Last Vitals:  Filed Vitals:   07/21/15 1140 07/21/15 1145  BP:  121/73  Pulse: 102 90  Temp:    Resp: 22 17    Last Pain:  Filed Vitals:   07/21/15 1204  PainSc: 0-No pain                 Cecile HearingStephen Edward Klein Willcox

## 2015-07-21 NOTE — Anesthesia Preprocedure Evaluation (Addendum)
Anesthesia Evaluation  Patient identified by MRN, date of birth, ID band Patient awake    Reviewed: Allergy & Precautions, NPO status , Patient's Chart, lab work & pertinent test results  Airway Mallampati: III  TM Distance: >3 FB Neck ROM: Full    Dental  (+) Teeth Intact, Dental Advisory Given, Implants,    Pulmonary neg pulmonary ROS,    Pulmonary exam normal breath sounds clear to auscultation       Cardiovascular Exercise Tolerance: Good negative cardio ROS Normal cardiovascular exam Rhythm:Regular Rate:Normal     Neuro/Psych negative neurological ROS  negative psych ROS   GI/Hepatic negative GI ROS, Neg liver ROS,   Endo/Other  Obesity   Renal/GU negative Renal ROS     Musculoskeletal negative musculoskeletal ROS (+)   Abdominal   Peds  Hematology  (+) Blood dyscrasia, anemia , Protein C and Protein S deficiency--on prophylactic lovenox. Last dose 07/20/15 at 1130am.   Anesthesia Other Findings Day of surgery medications reviewed with the patient.  Reproductive/Obstetrics (+) Pregnancy 34 y.o. female G12P10-1-0-1 with IUP at 3613w5d by L/24 presenting for IOL 2/2 cholestasis of pregnancy                          Anesthesia Physical Anesthesia Plan  ASA: III  Anesthesia Plan: Spinal   Post-op Pain Management:    Induction:   Airway Management Planned:   Additional Equipment:   Intra-op Plan:   Post-operative Plan:   Informed Consent: I have reviewed the patients History and Physical, chart, labs and discussed the procedure including the risks, benefits and alternatives for the proposed anesthesia with the patient or authorized representative who has indicated his/her understanding and acceptance.   Dental advisory given  Plan Discussed with: CRNA, Anesthesiologist and Surgeon  Anesthesia Plan Comments: (Discussed risks and benefits of and differences between spinal and  general. Discussed risks of spinal including headache, backache, failure, bleeding, infection, and nerve damage. Patient consents to spinal. Questions answered. Coagulation studies and platelet count acceptable.)        Anesthesia Quick Evaluation

## 2015-07-22 ENCOUNTER — Encounter (HOSPITAL_COMMUNITY): Payer: Self-pay | Admitting: Obstetrics & Gynecology

## 2015-07-22 LAB — CBC
HEMATOCRIT: 26.9 % — AB (ref 36.0–46.0)
HEMOGLOBIN: 9.2 g/dL — AB (ref 12.0–15.0)
MCH: 30.3 pg (ref 26.0–34.0)
MCHC: 34.2 g/dL (ref 30.0–36.0)
MCV: 88.5 fL (ref 78.0–100.0)
Platelets: 180 10*3/uL (ref 150–400)
RBC: 3.04 MIL/uL — ABNORMAL LOW (ref 3.87–5.11)
RDW: 14.6 % (ref 11.5–15.5)
WBC: 12.5 10*3/uL — ABNORMAL HIGH (ref 4.0–10.5)

## 2015-07-22 NOTE — Progress Notes (Signed)
Post Partum Day 1 Subjective:  Elizabeth Sanford is a [age] y.o. [id number] 3853w6d s/p elective RTLCS.  No acute events overnight.  Pt denies problems with ambulating, voiding or po intake.  She reports vomiting throughout last night but has improved this morning.  Pain is well controlled.  She has not had flatus.  Lochia Small.  Plan for birth control is OCPs.  Method of Feeding: Breast.  Objective: Blood pressure 102/52, pulse 98, temperature 99 F (37.2 C), temperature source Oral, resp. rate 18, height 5\' 4"  (1.626 m), weight 95.255 kg (210 lb), last menstrual period 10/22/2014, SpO2 98 %, unknown if currently breastfeeding.  Physical Exam:  General: alert, cooperative and no distress Lochia:normal flow Chest: normal WOB Heart: Regular rate Abdomen: +BS, soft, mild TTP (appropriate). Incision c/d/i and bandaged Uterine Fundus: firm, uterus above umbilicus DVT Evaluation: No evidence of DVT seen on physical exam. Extremities: no edema   Recent Labs  07/20/15 2203 07/22/15 0641  HGB 10.8* 9.2*  HCT 33.4* 26.9*    Assessment/Plan:  ASSESSMENT: Elizabeth Sanford is a 34 y.o. [id number] 5653w6d POD #1 s/p ERLTCS. Doing well. Plan to re-start lovenox today given increased risk for thrombosis (hx protein c/s deficiency, postpartum, c/s) and continue 6 wks.   Plan for discharge tomorrow Continue routine PP care Breastfeeding support PRN  LOS: 2 days   Elizabeth ChockNoah Gerarda Conklin, MD. [facility] fellow

## 2015-07-22 NOTE — Anesthesia Postprocedure Evaluation (Signed)
Anesthesia Post Note  Patient: Hotel managerAmira Sanford  Procedure(s) Performed: Procedure(s) (LRB): CESAREAN SECTION (N/A)  Patient location during evaluation: Mother Baby Anesthesia Type: Spinal Level of consciousness: awake and oriented Pain management: pain level controlled Vital Signs Assessment: post-procedure vital signs reviewed and stable Respiratory status: spontaneous breathing and nonlabored ventilation Cardiovascular status: stable Postop Assessment: no headache, no backache, patient able to bend at knees, no signs of nausea or vomiting and adequate PO intake Anesthetic complications: no    Last Vitals:  Filed Vitals:   07/21/15 2343 07/22/15 0400  BP: 100/53 102/52  Pulse: 88 98  Temp: 36.9 C 37.2 C  Resp: 18 18    Last Pain:  Filed Vitals:   07/22/15 0620  PainSc: 4                  Donivin Wirt

## 2015-07-22 NOTE — Progress Notes (Signed)
Per Philipp DeputyKim Shaw CNM it is okay for patient to take Ibuprofen while on Lovenox

## 2015-07-22 NOTE — Addendum Note (Signed)
Addendum  created 07/22/15 0723 by Shanon PayorSuzanne M Keishon Chavarin, CRNA   Modules edited: Clinical Notes   Clinical Notes:  File: [id number]

## 2015-07-23 ENCOUNTER — Encounter: Payer: Self-pay | Admitting: Family

## 2015-07-23 MED ORDER — OXYCODONE-ACETAMINOPHEN 5-325 MG PO TABS: 1.0000 | ORAL_TABLET | ORAL | Status: AC | PRN

## 2015-07-23 MED ORDER — IBUPROFEN 600 MG PO TABS: 600.0000 mg | ORAL_TABLET | Freq: Four times a day (QID) | ORAL | Status: AC | PRN

## 2015-07-23 MED ORDER — SENNOSIDES-DOCUSATE SODIUM 8.6-50 MG PO TABS: 2.0000 | ORAL_TABLET | ORAL | Status: AC

## 2015-07-23 MED ORDER — ENOXAPARIN SODIUM 40 MG/0.4ML ~~LOC~~ SOLN: 40.0000 mg | SUBCUTANEOUS | Status: AC

## 2015-07-23 NOTE — Lactation Note (Signed)
This note was copied from the chart of Elizabeth Saja Vicars. Lactation Consultation Note  Patient Name: Elizabeth Sanford ZOXWR'UToday's Date: 07/23/2015 Reason for consult: Follow-up assessment per  mom experienced breast feeder 14 years ago , for 6 months , denies exp engorgement.  Baby is 49 hour old , 6& weight loss, ( 9 -8.9 oz ) , 38 6/[redacted] weeks GA ) , breast feeding range 7-60 mins, Latch scores - 9-7-9- ,  At 38 hours 6.7. Mom voiced concerns over baby having some feedings 5 mins long. LC reviewed basics of latching , and what is normal  Feeding patterns for infants. @ consult baby awake , LC changed a heavy wet diaper. LC assisted mom with latch , depth and positioning,  Baby fed 7 mins with consistent pattern , and gulping noted, increased with breast compressions. Baby released after 7 mins, nipple well rounded. Sore nipple and engorgement prevention and tx reviewed. LC set mom up with hand pump , #24 Flange good fit for today , when milk comes in may  need #27 , and LC provided for mom. Per mom the football position and support was much more comfortable compared to the cradle.  Mother informed of post-discharge support and given phone number to the lactation department, including services for phone call assistance; out-patient appointments; and breastfeeding support group. List of other breastfeeding resources in the community given in the handout. Encouraged mother to call for problems or concerns related to breastfeeding.   Maternal Data Has patient been taught Hand Expression?: Yes (breast are full , not engorged, with nodules , milk flows easily )  Feeding Feeding Type: Breast Fed Length of feed: 7 min (baby gulping at the breast , released after 7 mins )  LATCH Score/Interventions Latch: Grasps breast easily, tongue down, lips flanged, rhythmical sucking. Intervention(s): Adjust position;Assist with latch;Breast massage;Breast compression  Audible Swallowing: Spontaneous and  intermittent  Type of Nipple: Everted at rest and after stimulation  Comfort (Breast/Nipple): Filling, red/small blisters or bruises, mild/mod discomfort  Problem noted: Filling  Hold (Positioning): Assistance needed to correctly position infant at breast and maintain latch. (worked on depth and positioning, per mom more comfortable ) Intervention(s): Breastfeeding basics reviewed;Support Pillows;Position options;Skin to skin  LATCH Score: 8  Lactation Tools Discussed/Used Tools: Pump;Flanges Flange Size: 27 Breast pump type: Manual Pump Review: Setup, frequency, and cleaning Initiated by:: MAI  Date initiated:: 07/23/15   Consult Status Consult Status: Complete Date: 07/23/15 Follow-up type: In-patient    Kathrin Greathouseorio, Yentl Verge Ann 07/23/2015, 11:57 AM

## 2015-07-23 NOTE — Discharge Instructions (Signed)
Cesarean Delivery, Care After Refer to this sheet in the next few weeks. These instructions provide you with information on caring for yourself after your procedure. Your health care provider may also give you specific instructions. Your treatment has been planned according to current medical practices, but problems sometimes occur. Call your health care provider if you have any problems or questions after you go home. HOME CARE INSTRUCTIONS  Only take over-the-counter or prescription medications as directed by your health care provider.  Do not drink alcohol, especially if you are breastfeeding or taking medication to relieve pain.  Do not chew or smoke tobacco.  Continue to use good perineal care. Good perineal care includes:  Wiping your perineum from front to back.  Keeping your perineum clean.  Check your surgical cut (incision) daily for increased redness, drainage, swelling, or separation of skin.  Clean your incision gently with soap and water every day, and then pat it dry. If your health care provider says it is okay, leave the incision uncovered. Use a bandage (dressing) if the incision is draining fluid or appears irritated. If the adhesive strips across the incision do not fall off within 7 days, carefully peel them off.  Hug a pillow when coughing or sneezing until your incision is healed. This helps to relieve pain.  Do not use tampons or douche until your health care provider says it is okay.  Shower, wash your hair, and take tub baths as directed by your health care provider.  Wear a well-fitting bra that provides breast support.  Limit wearing support panties or control-top hose.  Drink enough fluids to keep your urine clear or pale yellow.  Eat high-fiber foods such as whole grain cereals and breads, brown rice, beans, and fresh fruits and vegetables every day. These foods may help prevent or relieve constipation.  Resume activities such as climbing stairs,  driving, lifting, exercising, or traveling as directed by your health care provider.  Talk to your health care provider about resuming sexual activities. This is dependent upon your risk of infection, your rate of healing, and your comfort and desire to resume sexual activity.  Try to have someone help you with your household activities and your newborn for at least a few days after you leave the hospital.  Rest as much as possible. Try to rest or take a nap when your newborn is sleeping.  Increase your activities gradually.  Keep all of your scheduled postpartum appointments. It is very important to keep your scheduled follow-up appointments. At these appointments, your health care provider will be checking to make sure that you are healing physically and emotionally. SEEK MEDICAL CARE IF:   You are passing large clots from your vagina. Save any clots to show your health care provider.  You have a foul smelling discharge from your vagina.  You have trouble urinating.  You are urinating frequently.  You have pain when you urinate.  You have a change in your bowel movements.  You have increasing redness, pain, or swelling near your incision.  You have pus draining from your incision.  Your incision is separating.  You have painful, hard, or reddened breasts.  You have a severe headache.  You have blurred vision or see spots.  You feel sad or depressed.  You have thoughts of hurting yourself or your newborn.  You have questions about your care, the care of your newborn, or medications.  You are dizzy or light-headed.  You have a rash.  You  have pain, redness, or swelling at the site of the removed intravenous access (IV) tube.  You have nausea or vomiting.  You stopped breastfeeding and have not had a menstrual period within 12 weeks of stopping.  You are not breastfeeding and have not had a menstrual period within 12 weeks of delivery.  You have a fever. SEEK  IMMEDIATE MEDICAL CARE IF:  You have persistent pain.  You have chest pain.  You have shortness of breath.  You faint.  You have leg pain.  You have stomach pain.  Your vaginal bleeding saturates 2 or more sanitary pads in 1 hour. MAKE SURE YOU:   Understand these instructions.  Will watch your condition.  Will get help right away if you are not doing well or get worse.   This information is not intended to replace advice given to you by your health care provider. Make sure you discuss any questions you have with your health care provider.   Document Released: 04/30/2002 Document Revised: 08/29/2014 Document Reviewed: 04/04/2012 Elsevier Interactive Patient Education 2016 [employer].   Iron-Rich Diet  Iron is a mineral that helps your body to produce hemoglobin. Hemoglobin is a protein in your red blood cells that carries oxygen to your body's tissues. Eating too little iron may cause you to feel weak and tired, and it can increase your risk for infection. Eating enough iron is necessary for your body's metabolism, muscle function, and nervous system. Iron is naturally found in many foods. It can also be added to foods or fortified in foods. There are two types of dietary iron:  Heme iron. Heme iron is absorbed by the body more easily than nonheme iron. Heme iron is found in meat, poultry, and fish.  Nonheme iron. Nonheme iron is found in dietary supplements, iron-fortified grains, beans, and vegetables. You may need to follow an iron-rich diet if:  You have been diagnosed with iron deficiency or iron-deficiency anemia.  You have a condition that prevents you from absorbing dietary iron, such as:  Infection in your intestines.  Celiac disease. This involves long-lasting (chronic) inflammation of your intestines.  You do not eat enough iron.  You eat a diet that is high in foods that impair iron absorption.  You have lost a lot of blood.  You have heavy bleeding  during your menstrual cycle.  You are pregnant. WHAT IS MY PLAN? Your health care provider may help you to determine how much iron you need per day based on your condition. Generally, when a person consumes sufficient amounts of iron in the diet, the following iron needs are met:  Men.  23-52 years old: 11 mg per day.  34-63 years old: 8 mg per day.  Women.   22-86 years old: 15 mg per day.  63-87 years old: 18 mg per day.  Over 49 years old: 8 mg per day.  Pregnant women: 27 mg per day.  Breastfeeding women: 9 mg per day. WHAT DO I NEED TO KNOW ABOUT AN IRON-RICH DIET?  Eat fresh fruits and vegetables that are high in vitamin C along with foods that are high in iron. This will help increase the amount of iron that your body absorbs from food, especially with foods containing nonheme iron. Foods that are high in vitamin C include oranges, peppers, tomatoes, and mango.  Take iron supplements only as directed by your health care provider. Overdose of iron can be life-threatening. If you were prescribed iron supplements, take them with orange  juice or a vitamin C supplement.  Cook foods in pots and pans that are made from iron.   Eat nonheme iron-containing foods alongside foods that are high in heme iron. This helps to improve your iron absorption.   Certain foods and drinks contain compounds that impair iron absorption. Avoid eating these foods in the same meal as iron-rich foods or with iron supplements. These include:  Coffee, black tea, and red wine.  Milk, dairy products, and foods that are high in calcium.  Beans, soybeans, and peas.  Whole grains.  When eating foods that contain both nonheme iron and compounds that impair iron absorption, follow these tips to absorb iron better.   Soak beans overnight before cooking.  Soak whole grains overnight and drain them before using.  Ferment flours before baking, such as using yeast in bread dough. WHAT FOODS CAN I  EAT? Grains Iron-fortified breakfast cereal. Iron-fortified whole-wheat bread. Enriched rice. Sprouted grains. Vegetables Spinach. Potatoes with skin. Green peas. Broccoli. Red and green bell peppers. Fermented vegetables. Fruits Prunes. Raisins. Oranges. Strawberries. Mango. Grapefruit. Meats and Other Protein Sources Beef liver. Oysters. Beef. Shrimp. [place]. Chicken. [place]. Sardines. Chickpeas. Nuts. Tofu. Beverages Tomato juice. Fresh orange juice. Prune juice. Hibiscus tea. Fortified instant breakfast shakes. Condiments Tahini. Fermented soy sauce. Sweets and Desserts Black-strap molasses.  Other Wheat germ. The items listed above may not be a complete list of recommended foods or beverages. Contact your dietitian for more options. WHAT FOODS ARE NOT RECOMMENDED? Grains Whole grains. Bran cereal. Bran flour. Oats. Vegetables Artichokes. Brussels sprouts. Kale. Fruits Blueberries. Raspberries. Strawberries. Figs. Meats and Other Protein Sources Soybeans. Products made from soy protein. Dairy Milk. Cream. Cheese. Yogurt. Cottage cheese. Beverages Coffee. Black tea. Red wine. Sweets and Desserts Cocoa. Chocolate. Ice cream. Other Basil. Oregano. Parsley. The items listed above may not be a complete list of foods and beverages to avoid. Contact your dietitian for more information.   This information is not intended to replace advice given to you by your health care provider. Make sure you discuss any questions you have with your health care provider.   Document Released: 03/22/2005 Document Revised: 08/29/2014 Document Reviewed: 03/05/2014 Elsevier Interactive Patient Education [employer].

## 2015-07-23 NOTE — Progress Notes (Signed)
Post Partum Day 2 Subjective:  Elizabeth Sanford is a 34 y.o. [id number] 6497w6d s/p elective RLTCS due to cholestasis.  No acute events overnight.  Pt denies problems with ambulating, voiding or po intake.  She denies nausea or vomiting.  Pain is well controlled.  She has had flatus.  Lochia Small.  Antepartum pruritis continues to improve. Plan for birth control is oral progesterone-only contraceptive.  Method of Feeding: Breast  Objective: Blood pressure 122/66, pulse 93, temperature 98.4 F (36.9 C), temperature source Oral, resp. rate 18, height 5\' 4"  (1.626 m), weight 95.255 kg (210 lb), last menstrual period 10/22/2014, SpO2 99 %, unknown if currently breastfeeding.  Physical Exam:  General: alert, cooperative and no distress Lochia:normal flow Chest: normal WOB Heart: Regular rate Abdomen: +BS, soft, mild TTP (appropriate); honeycomb dressing CDI Uterine Fundus: firm, at umbilicus DVT Evaluation: No evidence of DVT seen on physical exam. Extremities: no edema   Recent Labs  07/20/15 2203 07/22/15 0641  HGB 10.8* 9.2*  HCT 33.4* 26.9*    Assessment/Plan:  ASSESSMENT: Elizabeth Sanford is a 34 y.o. [id number] 3797w6d s/p elective RLTCS secondary to cholestasis of pregnancy. She continues to improve with no complaints this morning. Patient feels that she would be comfortable with discharge tomorrow.  Continue lovenox for 6 weeks post-partum.  Plan for discharge tomorrow Continue routine PP care Breastfeeding support PRN  LOS: 3 days   Henrietta HooverWarren M Taylor 07/23/2015, 8:00 AM   I have seen and examined this patient and I agree with the above. See Discharge Summary for 07/23/15. Cam HaiSHAW, Azaryah Oleksy 10:25 PM 07/24/2015

## 2015-07-24 LAB — TYPE AND SCREEN
ABO/RH(D): B POS
ANTIBODY SCREEN: NEGATIVE
UNIT DIVISION: 0
Unit division: 0

## 2015-07-24 NOTE — Discharge Summary (Signed)
OB Discharge Summary     Patient Name: Elizabeth Sanford DOB: 1980-12-11 MRN: [id number]  Date of admission: 07/20/2015 Delivering MD: Jaynie CollinsANYANWU, UGONNA A   Date of discharge: 07/24/2015  Admitting diagnosis: Initially for induction of cholestasis, then decision for repeat LTCS instead Intrauterine pregnancy: 628w6d     Secondary diagnosis:  Active Problems:   Pregnancy  Additional problems: Cholestasis; Protein S & C deficiency     Discharge diagnosis: Term Pregnancy Delivered                                                                                                Post partum procedures:none  Augmentation: N/A  Complications: None  Hospital course:  Sceduled C/S   34 y.o. yo [id number] at [redacted]w[redacted]d was admitted to the hospital 07/20/2015 for scheduled cesarean section with the following indication:Elective Repeat.  Membrane Rupture Time/Date: 10:05 AM ,07/21/2015   Patient delivered a Viable infant.07/21/2015  Details of operation can be found in separate operative note.  Pateint had an uncomplicated postpartum course.  She is ambulating, tolerating a regular diet, passing flatus, and urinating well. Patient is discharged home in stable condition on 07/23/2015  3:55 PM  She was started on Lovenox on PPD#1 which will continue x 6 weeks.        Physical exam  Filed Vitals:   07/22/15 0400 07/22/15 0900 07/22/15 1825 07/23/15 0635  BP: 102/52 108/54 123/62 122/66  Pulse: 98 98 101 93  Temp: 99 F (37.2 C) 98.8 F (37.1 C) 98.5 F (36.9 C) 98.4 F (36.9 C)  TempSrc: Oral Oral Oral Oral  Resp: 18 20 18 18   Height:      Weight:      SpO2: 98% 98%  99%   General: alert and cooperative Lochia: appropriate Uterine Fundus: firm Incision: Dressing is clean, dry, and intact DVT Evaluation: No evidence of DVT seen on physical exam. Labs: Lab Results  Component Value Date   WBC 12.5* 07/22/2015   HGB 9.2* 07/22/2015   HCT 26.9* 07/22/2015   MCV 88.5 07/22/2015   PLT 180  07/22/2015   CMP Latest Ref Rng 07/18/2015  Glucose 65 - 99 mg/dL 366(Y100(H)  BUN 6 - 20 mg/dL 15  Creatinine 4.030.44 - 4.741.00 mg/dL 2.590.52  Sodium 563135 - 875145 mmol/L 132(L)  Potassium 3.5 - 5.1 mmol/L 3.8  Chloride 101 - 111 mmol/L 104  CO2 22 - 32 mmol/L 19(L)  Calcium 8.9 - 10.3 mg/dL 9.1  Total Protein 6.5 - 8.1 g/dL 7.0  Total Bilirubin 0.3 - 1.2 mg/dL 0.7  Alkaline Phos 38 - 126 U/L 149(H)  AST 15 - 41 U/L 52(H)  ALT 14 - 54 U/L 73(H)    Discharge instruction: per After Visit Summary and "Baby and Me Booklet".  After visit meds:    Medication List    STOP taking these medications        acetaminophen 500 MG tablet  Commonly known as:  TYLENOL     ursodiol 300 MG capsule  Commonly known as:  ACTIGALL      TAKE these medications  enoxaparin 40 MG/0.4ML injection  Commonly known as:  LOVENOX  Inject 0.4 mLs (40 mg total) into the skin daily.     enoxaparin 40 MG/0.4ML injection  Commonly known as:  LOVENOX  Inject 0.4 mLs (40 mg total) into the skin daily.     ibuprofen 600 MG tablet  Commonly known as:  ADVIL,MOTRIN  Take 1 tablet (600 mg total) by mouth every 6 (six) hours as needed.     loratadine 10 MG tablet  Commonly known as:  CLARITIN  Take 10 mg by mouth daily.     oxyCODONE-acetaminophen 5-325 MG tablet  Commonly known as:  PERCOCET/ROXICET  Take 1 tablet by mouth every 4 (four) hours as needed (for pain scale 4-7).     prenatal multivitamin Tabs tablet  Take 1 tablet by mouth daily at 12 noon.     senna-docusate 8.6-50 MG tablet  Commonly known as:  Senokot-S  Take 2 tablets by mouth daily.        Diet: routine diet  Activity: Advance as tolerated. Pelvic rest for 6 weeks.   Outpatient follow up:6 weeks Follow up Appt:Future Appointments Date Time Provider Department Center  08/27/2015 1:15 PM Adam Phenix, MD [facility]   Follow up Visit:No Follow-up on file.  Postpartum contraception: Progesterone only pills  Newborn Data: Live  born female  Birth Weight: 10 lb 2.6 oz (4610 g) APGAR: 8, 8  Baby Feeding: Breast Disposition:home with mother   07/24/2015 Cam Hai, CNM  07/24/2015

## 2015-07-30 ENCOUNTER — Encounter: Payer: Self-pay | Admitting: Family

## 2015-08-27 ENCOUNTER — Ambulatory Visit: Payer: Self-pay | Admitting: Obstetrics & Gynecology

## 2015-09-17 ENCOUNTER — Ambulatory Visit: Payer: Self-pay | Admitting: Obstetrics & Gynecology

## 2015-10-14 ENCOUNTER — Encounter: Payer: Self-pay | Admitting: *Deleted

## 2016-06-04 IMAGING — US US MFM OB FOLLOW-UP
1 series · 14 of 28 positions shown · non-contrast
Comparison: none

[Series 1: us mfm ob follow-up · 58 acquisitions, 14 frames shown]
[im 3/58]
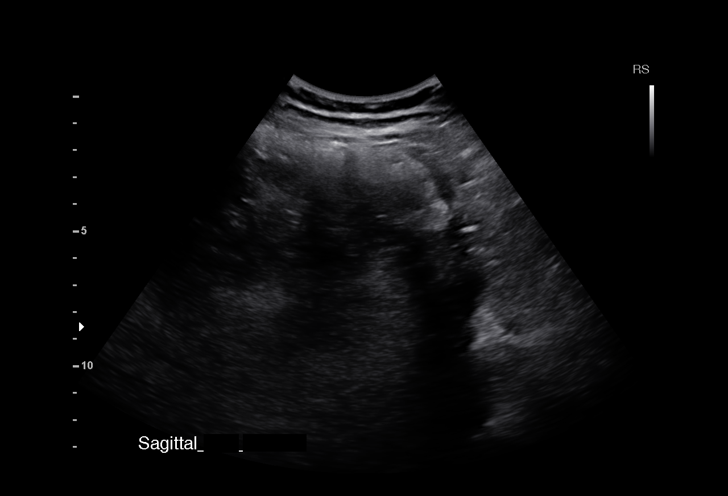
[im 7/58]
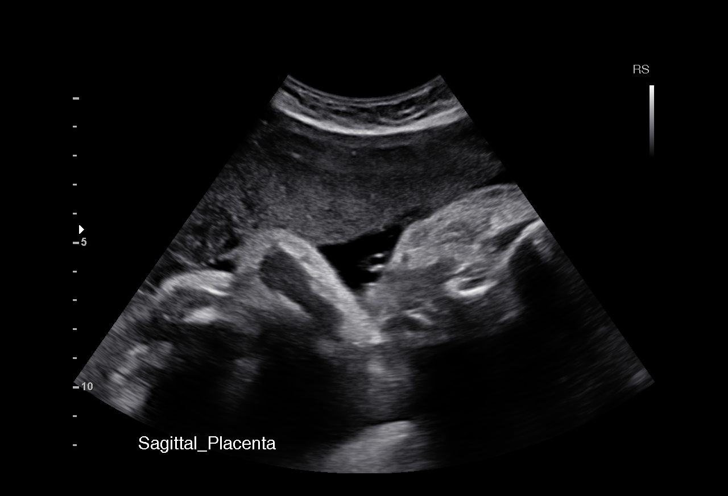
[im 11/58]
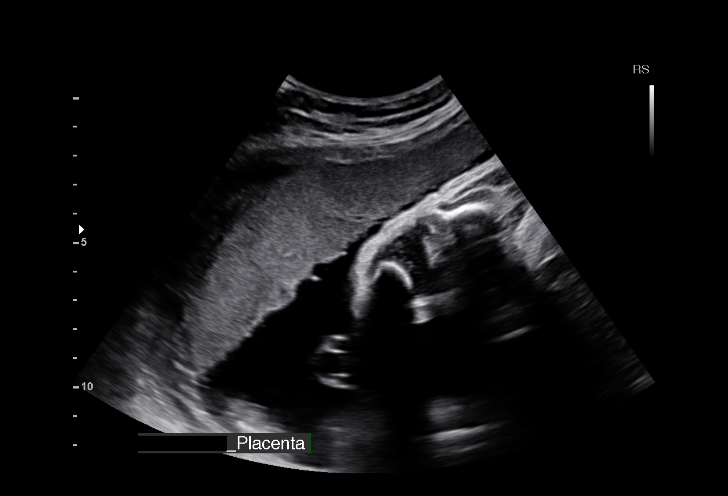
[im 15/58]
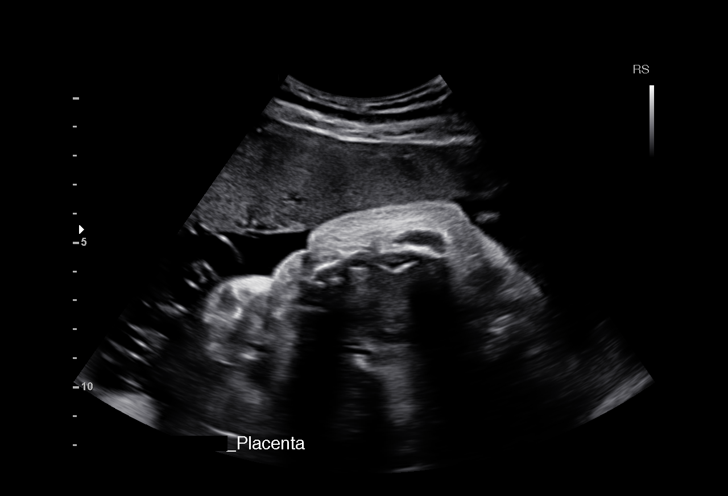
[im 20/58]
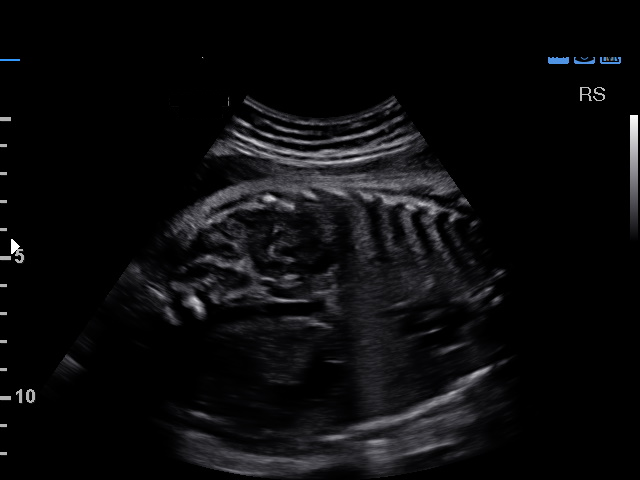
[im 24/58]
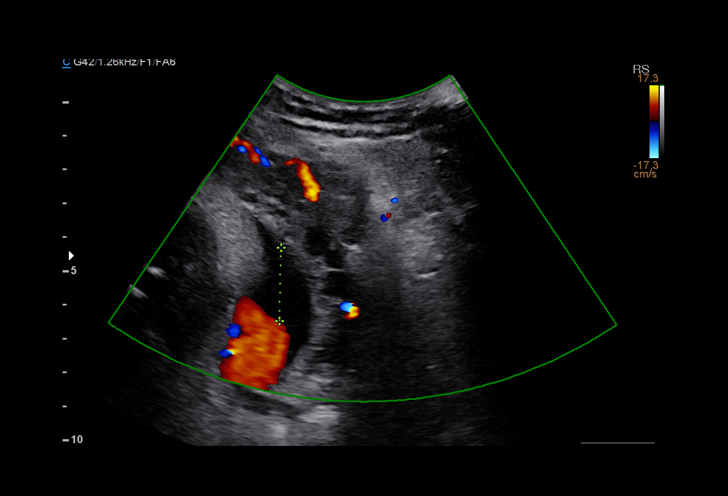
[im 28/58]
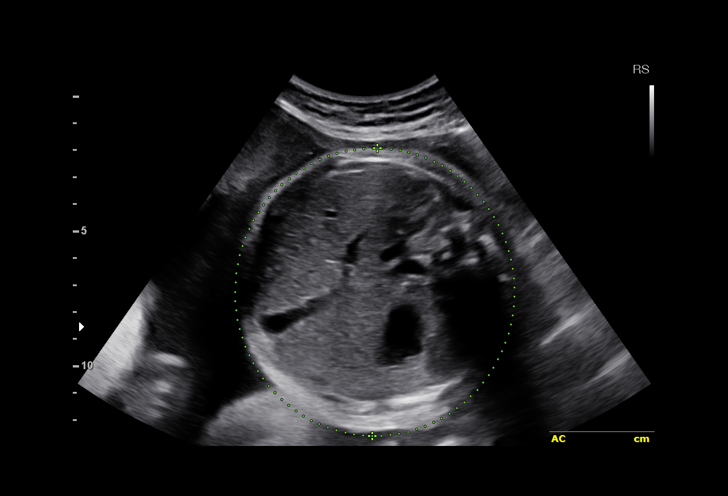
[im 32/58]
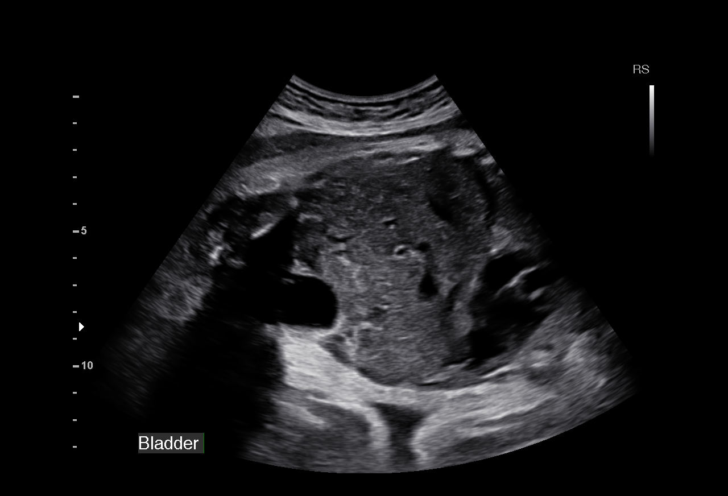
[im 36/58]
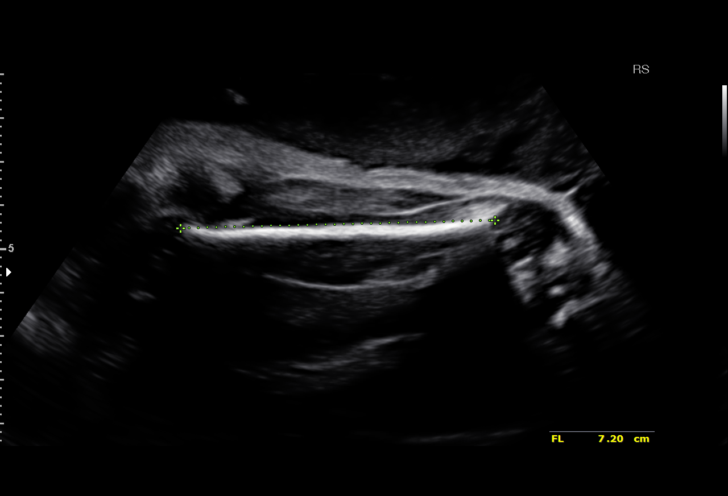
[im 41/58]
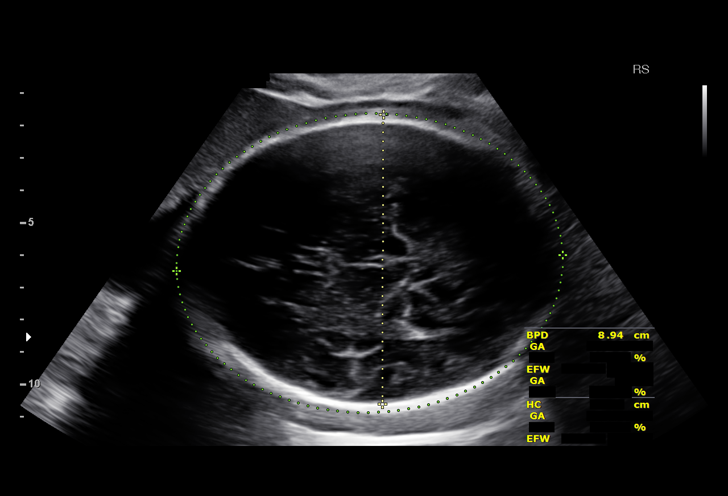
[im 45/58]
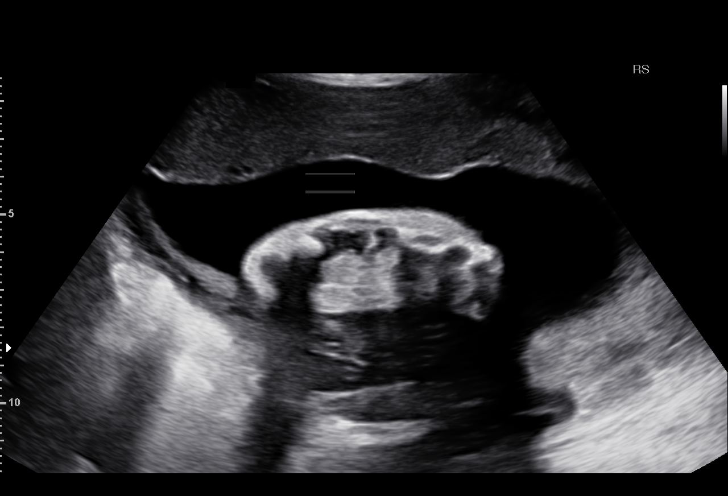
[im 49/58]
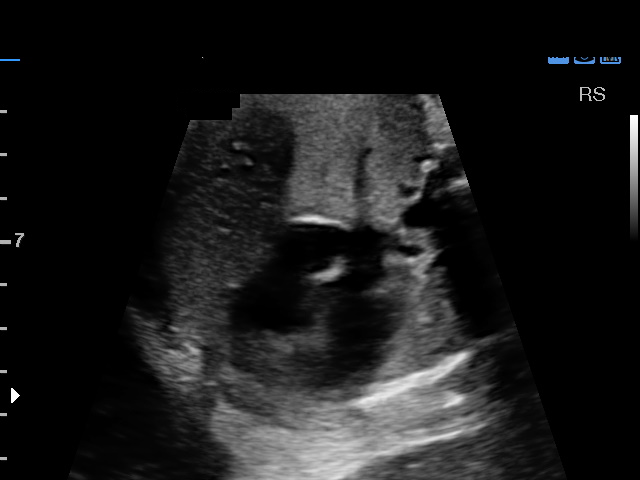
[im 53/58]
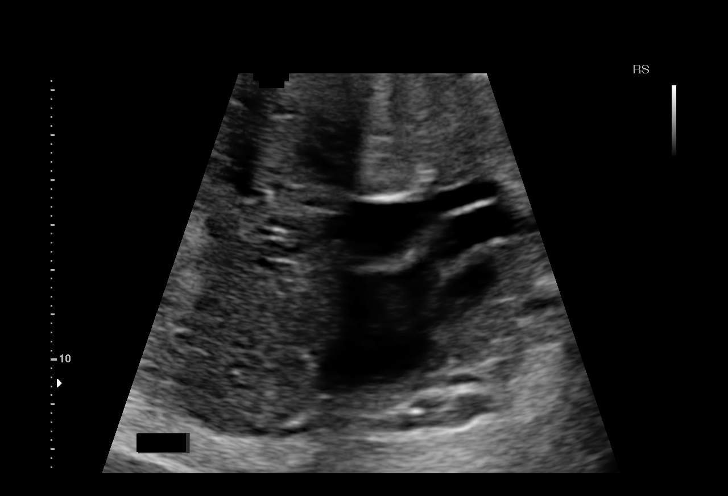
[im 58/58]
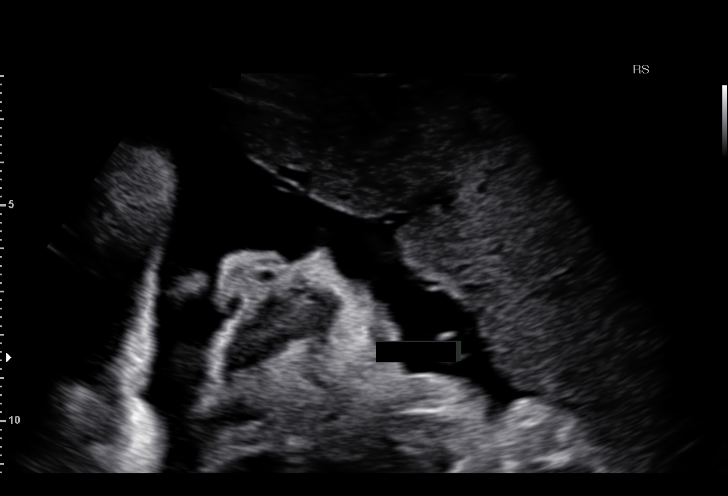

[14 of 28 positions shown; findings below may reference images not displayed]

OBSTETRICS REPORT
(Signed Final 06/15/2015 [DATE])

Name:       TEELELENI SAKUPAULA                           Visit  06/12/2015 [DATE]
ELBOSRATY                            Date:

Service(s) Provided

Indications

Poor obstetric history-Recurrent (habitual)
abortion (10 SAB)
Previous cesarean section
Medical complication of pregnancy (protein C and
S deficiency) - Lovenox 40 mg qd/ASA
33 weeks gestation of pregnancy
Advanced maternal age multigravida 35+, third
trimester; no testing
Cervical incompetence, third trimester
Cervical cerclage suture present, third trimester
Fetal Evaluation

Num Of             1
Fetuses:
Fetal Heart        144                          bpm
Rate:
Cardiac Activity:  Observed
Presentation:      Cephalic
Placenta:          Anterior, above cervical
os
P. Cord            Previously Visualized
Insertion:

Amniotic Fluid
AFI FV:      Subjectively within normal limits
AFI Sum:     12.27    cm      35  %Tile     Larg Pckt:      5.9  cm
RUQ:   1.04    cm    RLQ:   3.16    cm   LUQ:    2.17    cm   LLQ:    5.9    cm
Biometry

BPD:       89   m    G. Age:   36w 0d                 CI:        71.85   70 - 86
m
FL/HC:      21.6   19.9 -
21.5
HC:     334.2   m    G. Age:   38w 2d      > 97  %    HC/AC:      1.01   0.96 -
m
AC:     329.8   m    G. Age:   36w 6d      > 97  %    FL/BPD      81.1   71 - 87
m                                     :
FL:      72.2   m    G. Age:   37w 0d      > 97  %    FL/AC:      21.9   20 - 24
m
HUM:     62.6   m    G. Age:   36w 2d      > 95  %
m

Est.        5178   gm   6 lb 13 oz    > 90   %
FW:
Gestational Age

LMP:           33w 2d        Date:  10/22/14                  EDD:   07/29/15
U/S Today:     37w 0d                                         EDD:   07/03/15
Best:          33w 2d    Det. By:   LMP  (10/22/14)           EDD:   07/29/15
Anatomy

Cranium:          Previously seen        Aortic Arch:       Previously seen
Fetal Cavum:      Previously seen        Ductal Arch:       Previously seen
Ventricles:       Appears normal         Diaphragm:         Previously seen
Choroid Plexus:   Previously seen        Stomach:           Appears normal,
left sided
Cerebellum:       Previously seen        Abdomen:           Previously seen
Posterior         Previously seen        Abdominal          Previously seen
Fossa:                                   Wall:
Nuchal Fold:      Not applicable (>20    Cord Vessels:      Previously seen
wks GA)
Face:             Orbits and profile     Kidneys:           Appear normal
previously seen
Lips:             Previously seen        Bladder:           Appears normal
Heart:            Previously seen        Spine:             Previously seen
RVOT:             Previously seen        Lower              Previously seen
Extremities:
LVOT:             Appears normal         Upper              Previously seen
Extremities:

Other:   Male gender. Heels and 5th digit previously visualized. Nasal bone
previously visualized.
Targeted Anatomy

Fetal Central Nervous System
Lat. Ventricles:
Cervix Uterus Adnexa

Cervix:       Not visualized (advanced GA >54wks)
Uterus:       No abnormality visualized.

Left Ovary:    Not visualized.
Right Ovary:   Not visualized.

Adnexa:     No abnormality visualized. No adnexal mass visualized.
Impression

SIUP at 33+2 weeks
Normal interval anatomy; anatomic survey complete
Normal amniotic fluid volume
EFW > 90th% tile; AC > 97th %tile; fetus at risk to be
LGA/macrosomic
Recommendations

Follow-up ultrasound for growth in 4-5 weeks if repeat EFW
is desired

ELBOSRATY with us.  Please do not hesitate to contact

## 2016-07-05 IMAGING — US US MFM OB FOLLOW-UP
1 series · 12 of 28 positions shown · non-contrast
Comparison: none

[Series 1: growth · 0.21mm/px · 45 acquisitions, 12 frames shown]
[im 2/45]
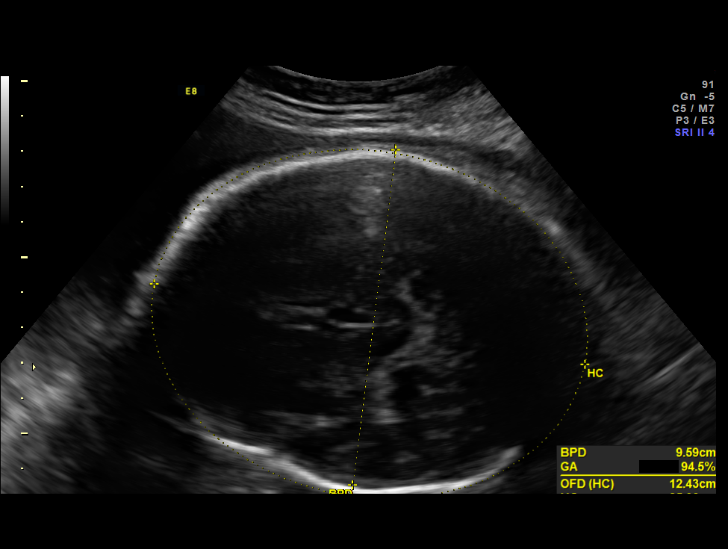
[im 5/45]
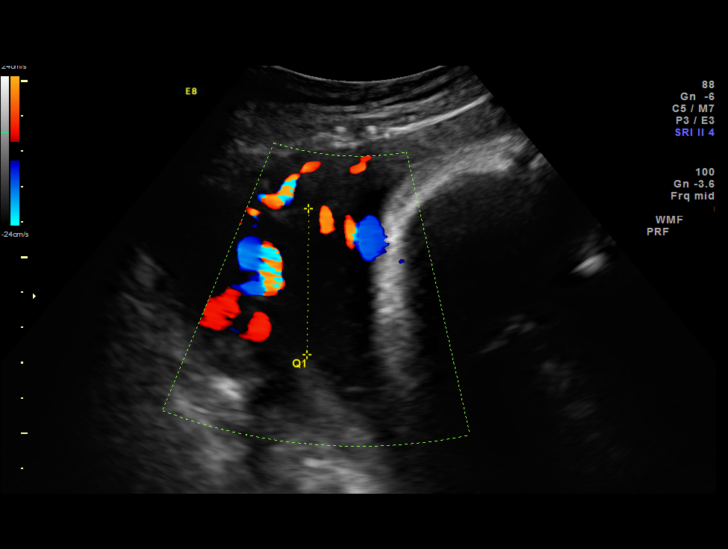
[im 9/45]
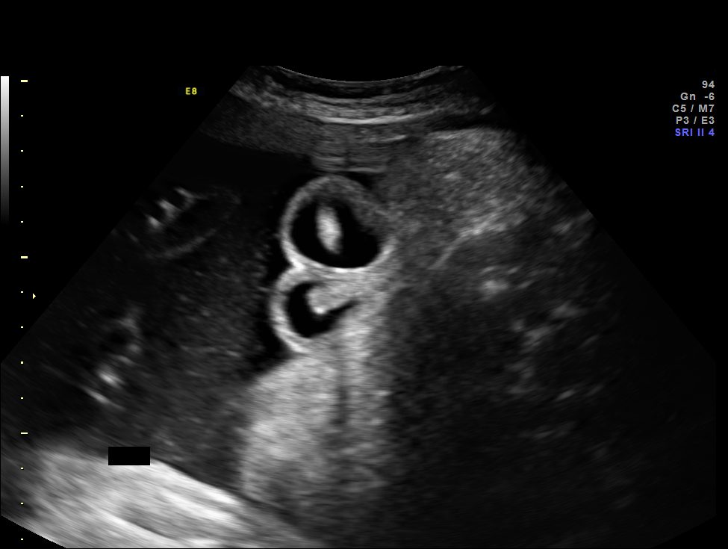
[im 14/45]
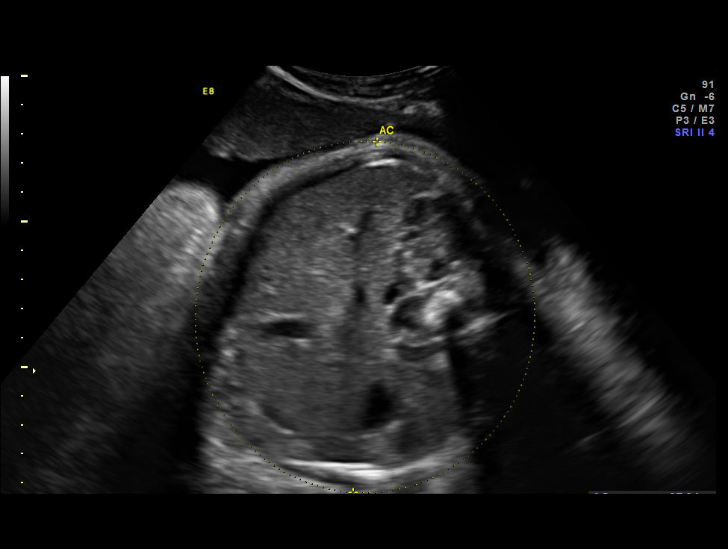
[im 17/45]
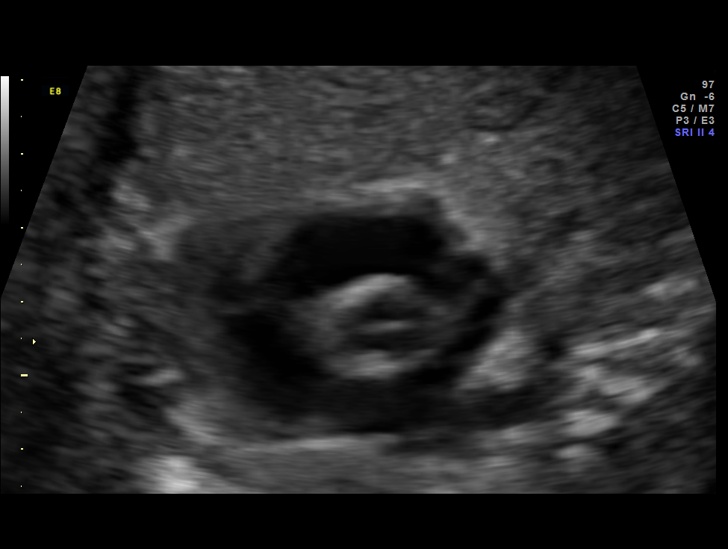
[im 20/45]
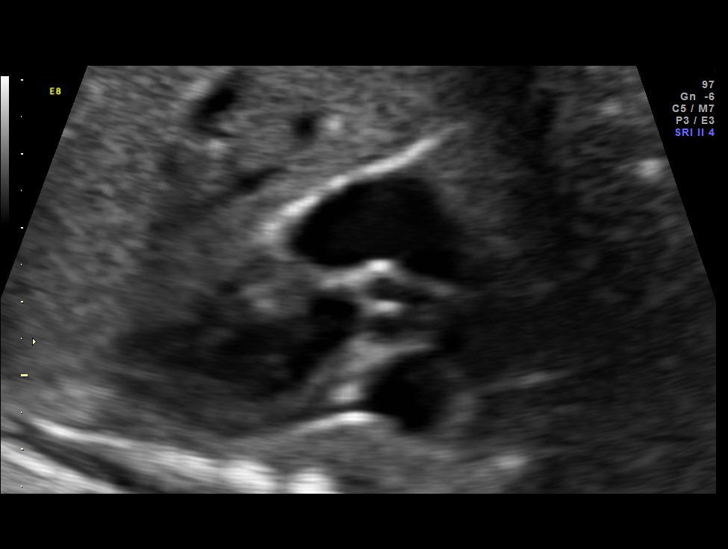
[im 25/45]
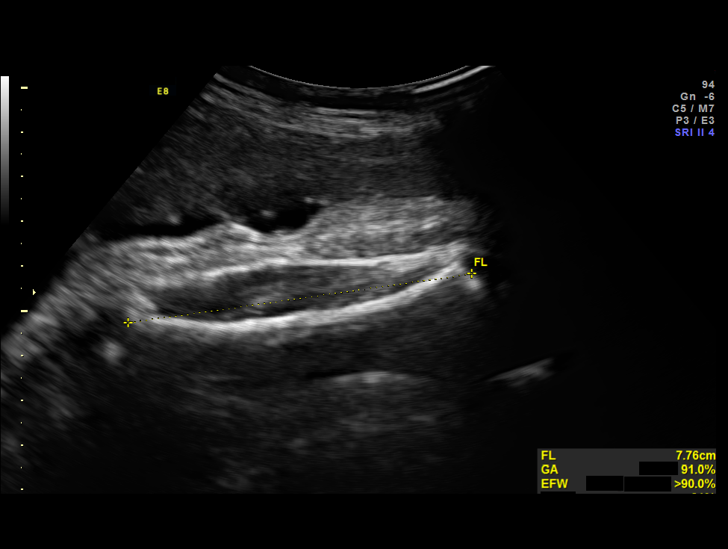
[im 28/45]
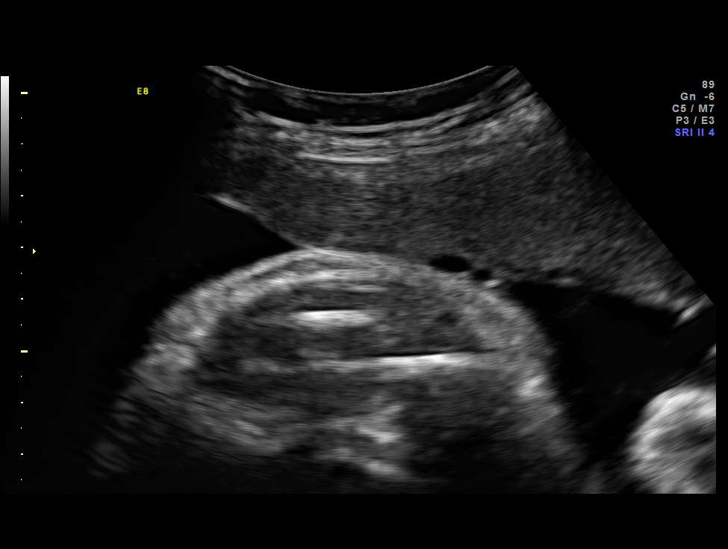
[im 31/45]
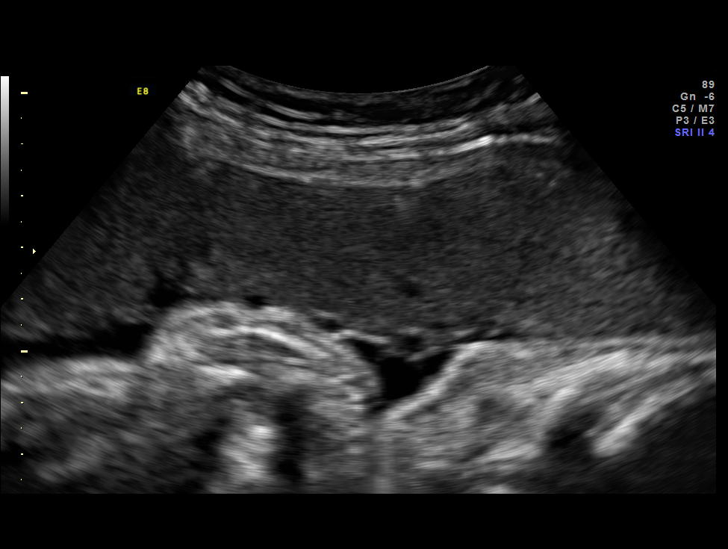
[im 36/45]
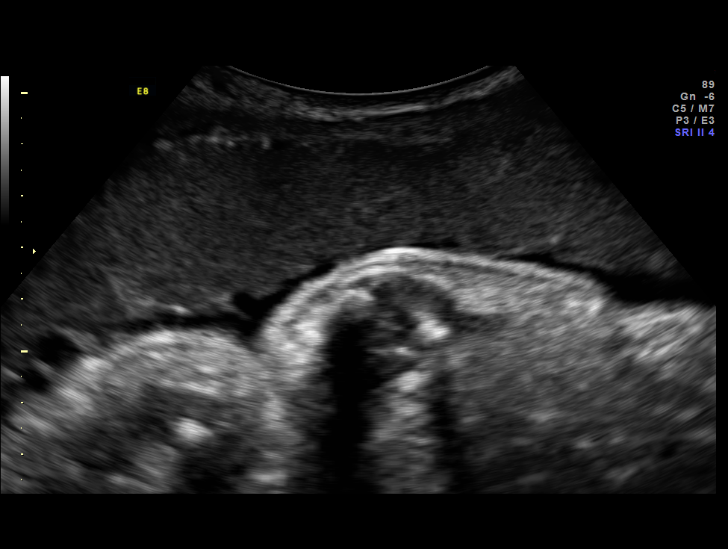
[im 40/45]
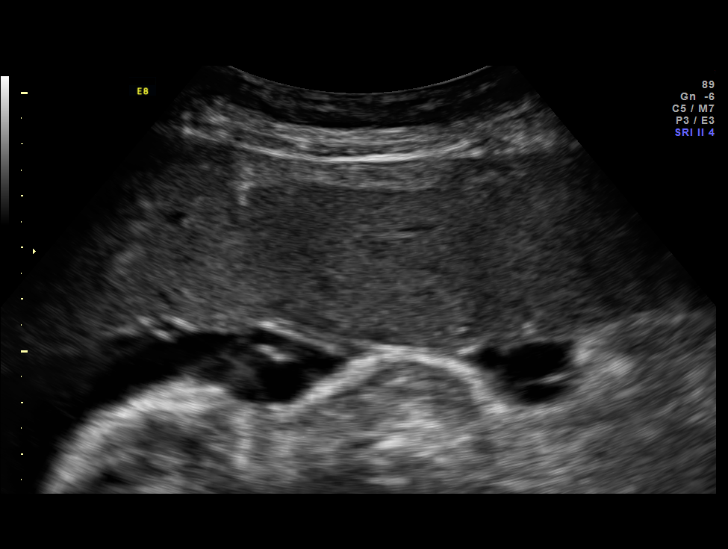
[im 43/45]
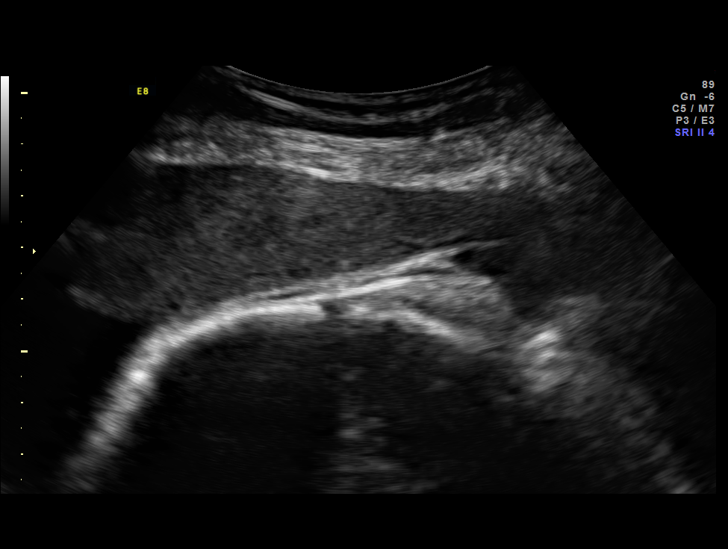

[12 of 28 positions shown; findings below may reference images not displayed]

OBSTETRICS REPORT
(Signed Final 07/13/2015 [DATE])

ELBOSRATY

Service(s) Provided

Indications

37 weeks gestation of pregnancy
Poor obstetric history-Recurrent (habitual) abortion
(10 SAB)
Previous cesarean section
Medical complication of pregnancy (protein C and
S deficiency) - Lovenox 40 mg qd/ASA
Advanced maternal age multigravida 35+ (35),
third trimester; no testing
Cervical incompetence, third trimester; cerclage
removed at 37 weeks
Fetal Evaluation

Num Of Fetuses:    1
Fetal Heart Rate:  144                          bpm
Cardiac Activity:  Observed
Presentation:      Cephalic
Placenta:          Anterior, above cervical os
P. Cord Insertion: Visualized

Amniotic Fluid
AFI FV:      Subjectively within normal limits
AFI Sum:     18.59    cm      72  %Tile      Larg Pckt:   8.53  cm
RUQ:   4.14    cm   RLQ:    4.19   cm    LUQ:   1.73    cm    LLQ:   8.53    cm
Biometry

BPD:     95.8   mm    G. Age:  39w 1d                CI:          76.6   70 - 86
OFD:    125.1   mm                                   FL/HC:       21.8   20.9 -
22.7
HC:       350   mm    G. Age:  40w 5d       90   %   HC/AC:       0.94   0.92 -
1.05
AC:     372.3   mm    G. Age:  41w 1d      > 97  %   FL/BPD:      79.5   71 - 87
FL:      76.2   mm    G. Age:  39w 0d       80   %   FL/AC:       20.5   20 - 24
HUM:     67.3   mm    G. Age:  39w 1d      > 95  %

Est. FW:    3850   gm          9 lb   > 90  %
Gestational Age
LMP:           37w 5d        Date:  10/22/14                 EDD:    07/29/15
U/S Today:     40w 0d                                        EDD:    07/13/15
Best:          37w 5d     Det. By:  LMP  (10/22/14)          EDD:    07/29/15
Anatomy

Cranium:          Appears normal         Aortic Arch:      Previously seen
Fetal Cavum:      Previously seen        Ductal Arch:      Previously seen
Ventricles:       Appears normal         Diaphragm:        Previously seen
Choroid Plexus:   Previously seen        Stomach:          Appears normal, left
sided
Cerebellum:       Previously seen        Abdomen:          Previously seen
Posterior Fossa:  Previously seen        Abdominal Wall:   Previously seen
Nuchal Fold:      Not applicable (>20    Cord Vessels:     Previously seen
wks GA)
Face:             Orbits and profile     Kidneys:          Appear normal
previously seen
Lips:             Previously seen        Bladder:          Appears normal
Heart:            Previously seen        Spine:            Previously seen
RVOT:             Appears normal         Lower             Previously seen
Extremities:
LVOT:             Appears normal         Upper             Previously seen
Extremities:

Other:  Male gender. Heels and 5th digit previously visualized. Nasal bone
previously visualized.
Cervix Uterus Adnexa

Cervix:       Not visualized (advanced GA >51wks)
Uterus:       No abnormality visualized.
Cul De Sac:   No free fluid seen.
Left Ovary:    Not visualized.
Right Ovary:   Not visualized.

Adnexa:     No abnormality visualized.
Impression

SIUP at 37+5 weeks
Normal interval anatomy; anatomic survey complete
Normal amniotic fluid volume
EFW > 90th %tile; AC > 97th %tile; fetus at risk to be LGA
Recommendations

Follow-up as clinically indicated

## 2016-07-10 IMAGING — US US MFM OB LIMITED
1 series · 15 of 16 positions shown · non-contrast
Comparison: none

[Series 1: us mfm ob limited · 16 acquisitions, 15 frames shown]
[im 1/16]
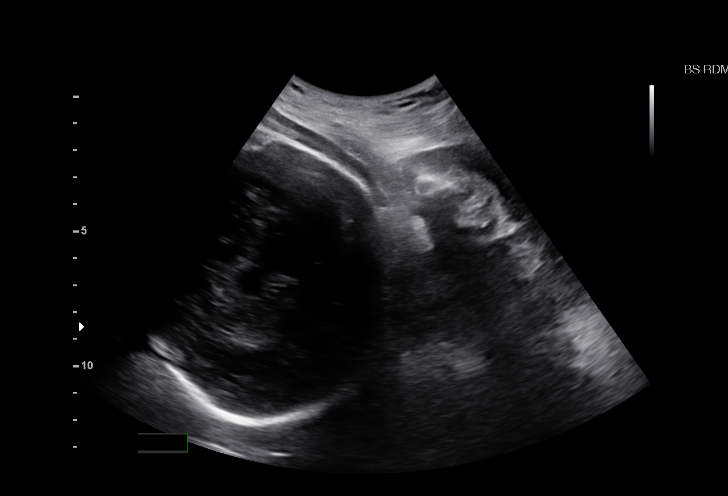
[im 2/16]
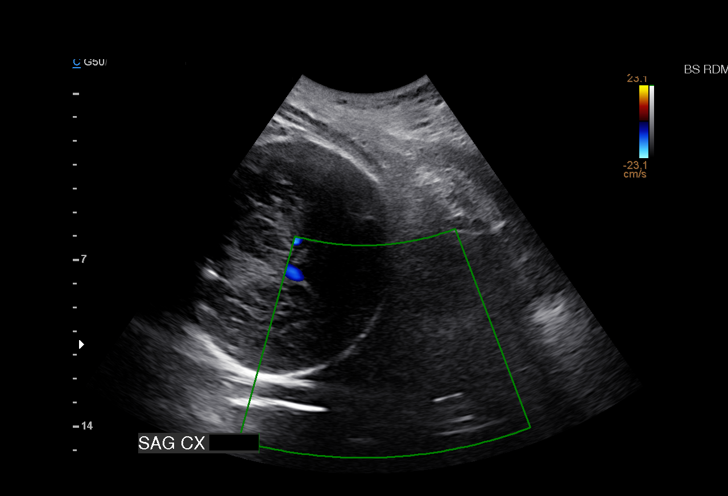
[im 3/16]
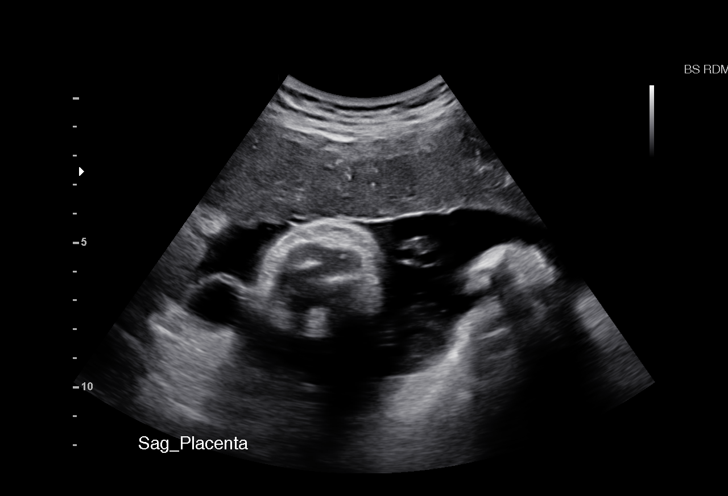
[im 4/16]
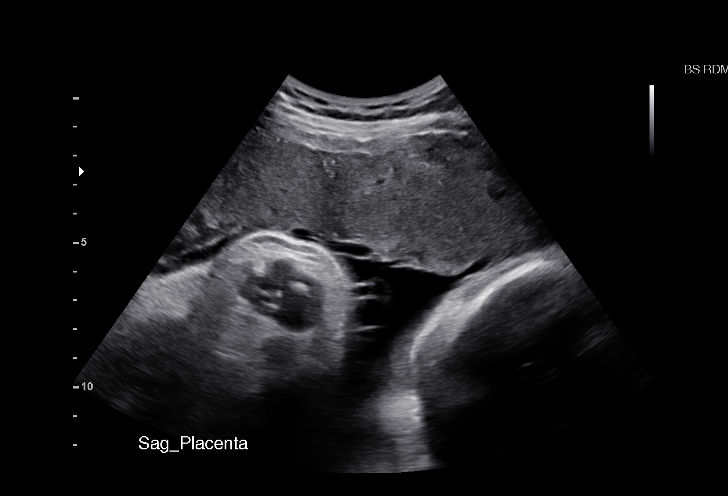
[im 5/16]
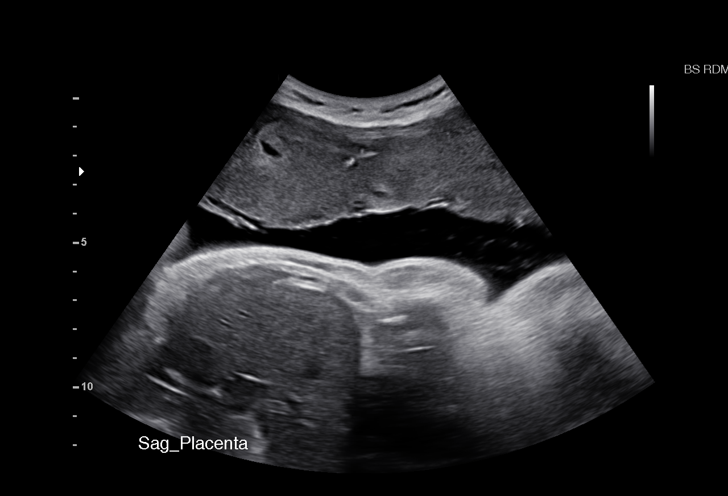
[im 6/16]
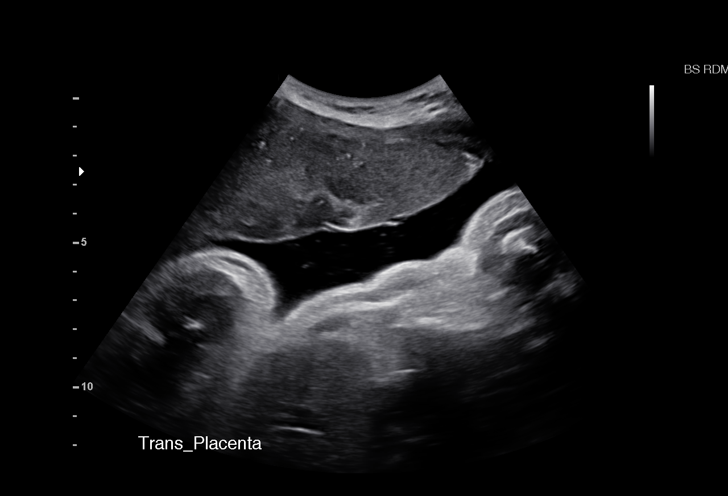
[im 7/16]
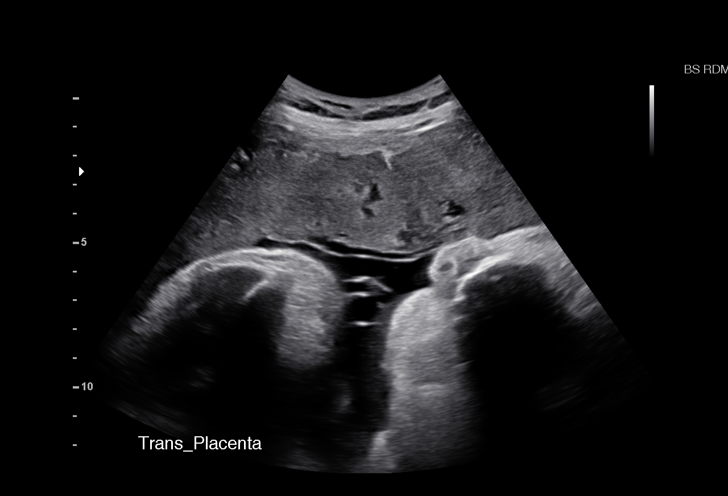
[im 9/16]
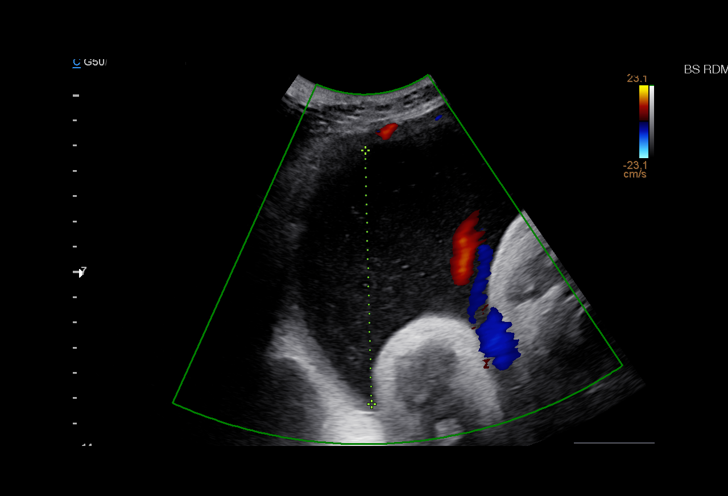
[im 10/16]
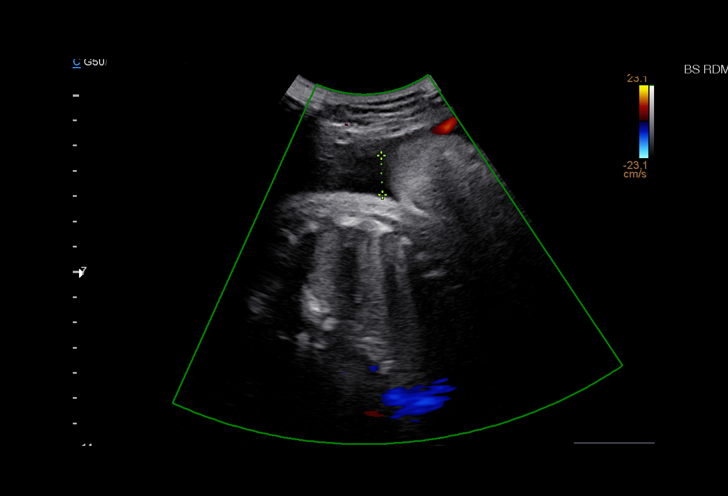
[im 11/16]
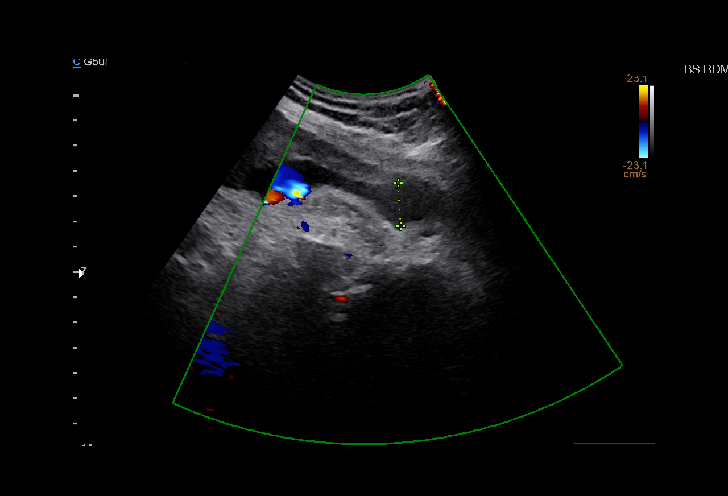
[im 12/16]
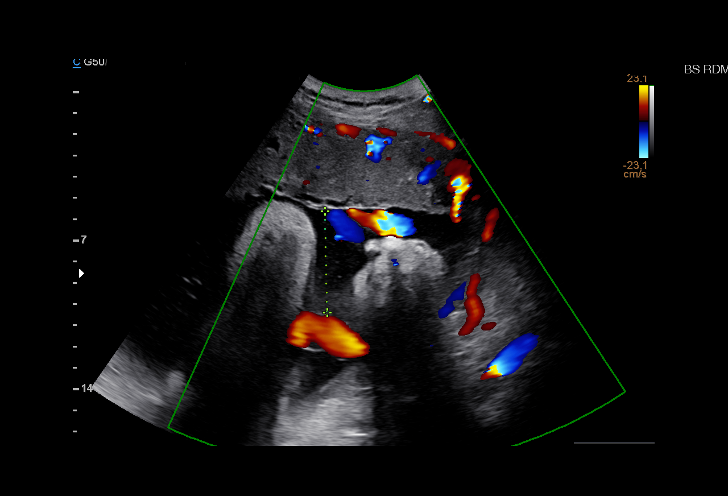
[im 13/16]
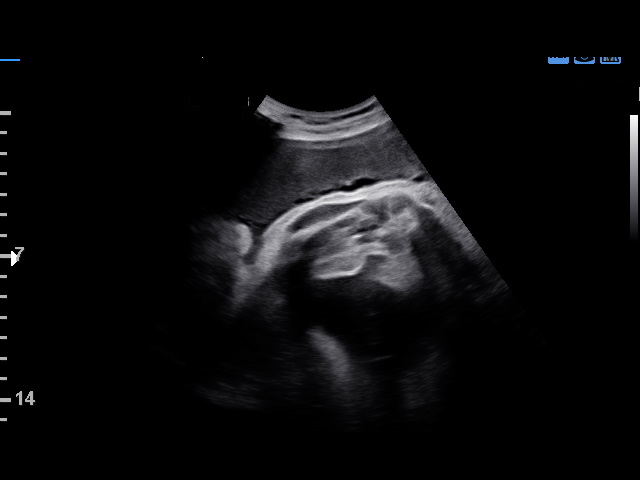
[im 14/16]
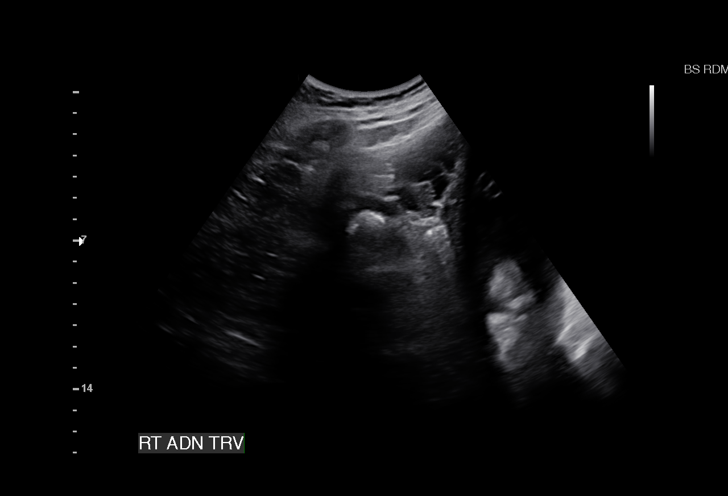
[im 15/16]
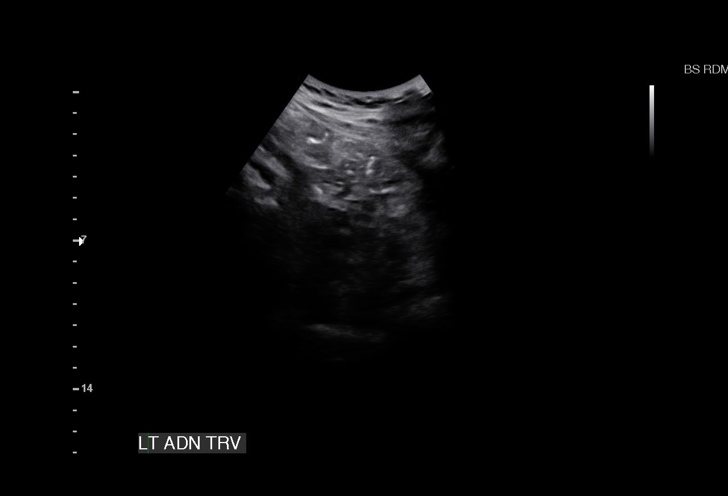
[im 16/16]
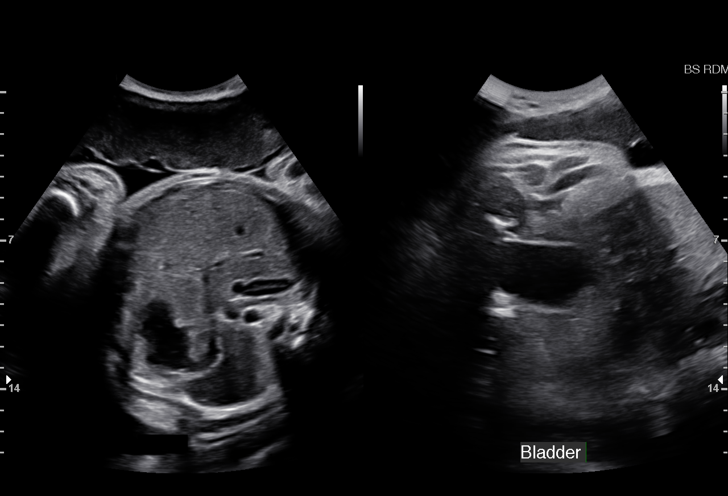

[15 of 16 positions shown; findings below may reference images not displayed]

OBSTETRICS REPORT
(Signed Final 07/18/2015 [DATE])

ELBOSRATY

Service(s) Provided

US MFM OB LIMITED                                     [id number]
Indications

Poor obstetric history-Recurrent (habitual) abortion
(10 SAB)
Previous cesarean section
Medical complication of pregnancy (protein C and
S deficiency) - Lovenox 40 mg qd/ASA
Advanced maternal age multigravida 35+ (35),
third trimester; no testing
Cervical incompetence, third trimester; cerclage
removed at 37 weeks
Elevated LFTs
Assess amniotic fluid volume
38 weeks gestation of pregnancy
Fetal Evaluation

Num Of Fetuses:    1
Fetal Heart Rate:  155                          bpm
Cardiac Activity:  Observed
Presentation:      Cephalic
Placenta:          Anterior, above cervical os
P. Cord            Previously Visualized
Insertion:

Amniotic Fluid
AFI FV:      Subjectively within normal limits
AFI Sum:     18.05   cm       72  %Tile     Larg Pckt:  10.03   cm
RUQ:   10.03   cm   RLQ:    4.76   cm    LUQ:   1.55    cm   LLQ:    1.71   cm
Gestational Age

LMP:           38w 3d        Date:  10/22/14                 EDD:   07/29/15
Best:          38w 3d     Det. By:  LMP  (10/22/14)          EDD:   07/29/15
Cervix Uterus Adnexa

Cervix:       Not visualized (advanced GA >72wks)
Left Ovary:    Previously seen.
Right Ovary:   Previously seen
Adnexa:     No abnormality visualized.
Impression

Singleton intrauterine pregnancy at 38 weeks 3 days
gestation with fetal cardiac activity
Cephalic presentation
AFI >18 cm
Recommendations

Follow-up ultrasounds as clinically indicated.
